# Patient Record
Sex: Male | Born: 1970 | Race: White | Hispanic: No | Marital: Married | Smoking: Former smoker
Health system: Southern US, Community
[De-identification: ages and names within clinical notes are randomized; demographics above are authoritative.]

## PROBLEM LIST (undated history)

## (undated) DIAGNOSIS — M6749 Ganglion, multiple sites: Secondary | ICD-10-CM

## (undated) DIAGNOSIS — R2230 Localized swelling, mass and lump, unspecified upper limb: Secondary | ICD-10-CM

## (undated) DIAGNOSIS — M7139 Other bursal cyst, multiple sites: Secondary | ICD-10-CM

## (undated) DIAGNOSIS — E785 Hyperlipidemia, unspecified: Secondary | ICD-10-CM

## (undated) DIAGNOSIS — M6789 Other specified disorders of synovium and tendon, multiple sites: Secondary | ICD-10-CM

## (undated) HISTORY — DX: Hyperlipidemia, unspecified: E78.5

## (undated) HISTORY — PX: REVISION OF SCAR ON FACE/HEAD: SHX2349

## (undated) HISTORY — PX: FINGER TENDON REPAIR: SHX1640

## (undated) HISTORY — PX: NASAL SINUS SURGERY: SHX719

## (undated) HISTORY — PX: EYE SURGERY: SHX253

---

## 2003-05-10 ENCOUNTER — Ambulatory Visit (HOSPITAL_COMMUNITY): Admission: RE | Admit: 2003-05-10 | Discharge: 2003-05-10 | Payer: Self-pay | Admitting: Gastroenterology

## 2006-05-27 ENCOUNTER — Ambulatory Visit: Payer: Self-pay | Admitting: Otolaryngology

## 2006-10-02 ENCOUNTER — Ambulatory Visit: Payer: Self-pay | Admitting: Family Medicine

## 2006-10-02 LAB — CONVERTED CEMR LAB
AST: 20 units/L (ref 0–37)
Albumin: 3.9 g/dL (ref 3.5–5.2)
Chloride: 106 meq/L (ref 96–112)
Cholesterol: 242 mg/dL (ref 0–200)
Creatinine, Ser: 1.2 mg/dL (ref 0.4–1.5)
GFR calc Af Amer: 90 mL/min
Glucose, Bld: 81 mg/dL (ref 70–99)
HDL: 41.3 mg/dL (ref >39.0)
MCHC: 34.7 g/dL (ref 30.0–36.0)
MCV: 88.7 fL (ref 78.0–100.0)
RBC: 5.33 M/uL (ref 4.22–5.81)
RDW: 12.9 % (ref 11.5–14.6)
Sodium: 141 meq/L (ref 135–145)
TSH: 1.38 microintl units/mL (ref 0.35–5.50)
Total Bilirubin: 1.2 mg/dL (ref 0.3–1.2)
Triglycerides: 135 mg/dL (ref 0–149)
VLDL: 27 mg/dL (ref 0–40)
WBC: 6.6 10*3/uL (ref 4.5–10.5)

## 2006-10-03 ENCOUNTER — Ambulatory Visit: Payer: Self-pay | Admitting: Family Medicine

## 2007-10-23 ENCOUNTER — Ambulatory Visit: Payer: Self-pay | Admitting: Family Medicine

## 2007-10-23 DIAGNOSIS — K6289 Other specified diseases of anus and rectum: Secondary | ICD-10-CM

## 2007-11-03 ENCOUNTER — Ambulatory Visit: Payer: Self-pay | Admitting: Family Medicine

## 2007-11-04 ENCOUNTER — Encounter: Payer: Self-pay | Admitting: Family Medicine

## 2007-11-04 DIAGNOSIS — K219 Gastro-esophageal reflux disease without esophagitis: Secondary | ICD-10-CM

## 2008-01-06 ENCOUNTER — Ambulatory Visit: Payer: Self-pay | Admitting: Family Medicine

## 2008-01-06 LAB — CONVERTED CEMR LAB
Albumin: 3.8 g/dL (ref 3.5–5.2)
Alkaline Phosphatase: 47 units/L (ref 39–117)
BUN: 15 mg/dL (ref 6–23)
Calcium: 9.1 mg/dL (ref 8.4–10.5)
Cholesterol: 165 mg/dL (ref 0–200)
Creatinine, Ser: 1.4 mg/dL (ref 0.4–1.5)
GFR calc Af Amer: 75 mL/min
GFR calc non Af Amer: 62 mL/min
Glucose, Bld: 131 mg/dL — ABNORMAL HIGH (ref 70–99)
HDL: 40.4 mg/dL (ref >39.0)
LDL Cholesterol: 101 mg/dL — ABNORMAL HIGH (ref 0–99)
TSH: 1.89 microintl units/mL (ref 0.35–5.50)
Total Protein: 6.5 g/dL (ref 6.0–8.3)
Triglycerides: 118 mg/dL (ref 0–149)
VLDL: 24 mg/dL (ref 0–40)

## 2008-06-16 ENCOUNTER — Ambulatory Visit: Payer: Self-pay | Admitting: Family Medicine

## 2008-06-16 DIAGNOSIS — R7309 Other abnormal glucose: Secondary | ICD-10-CM

## 2008-07-02 ENCOUNTER — Ambulatory Visit: Payer: Self-pay | Admitting: Family Medicine

## 2008-07-05 LAB — CONVERTED CEMR LAB: Glucose, Bld: 90 mg/dL (ref 70–99)

## 2009-03-30 ENCOUNTER — Telehealth: Payer: Self-pay | Admitting: Family Medicine

## 2009-07-28 ENCOUNTER — Ambulatory Visit: Payer: Self-pay | Admitting: Family Medicine

## 2009-07-28 LAB — CONVERTED CEMR LAB
Albumin: 4.3 g/dL (ref 3.5–5.2)
Alkaline Phosphatase: 51 units/L (ref 39–117)
Bilirubin, Direct: 0 mg/dL (ref 0.0–0.3)
CO2: 31 meq/L (ref 19–32)
Calcium: 9.3 mg/dL (ref 8.4–10.5)
Creatinine, Ser: 1.2 mg/dL (ref 0.4–1.5)
Creatinine,U: 175.3 mg/dL
Eosinophils Absolute: 0.1 10*3/uL (ref 0.0–0.7)
GFR calc non Af Amer: 72.73 mL/min (ref >60)
HDL: 54.1 mg/dL (ref >39.00)
Lymphocytes Relative: 37.2 % (ref 12.0–46.0)
MCHC: 34.2 g/dL (ref 30.0–36.0)
MCV: 95.3 fL (ref 78.0–100.0)
Microalb Creat Ratio: 6.3 mg/g (ref 0.0–30.0)
Microalb, Ur: 1.1 mg/dL (ref 0.0–1.9)
Monocytes Absolute: 0.5 10*3/uL (ref 0.1–1.0)
Neutrophils Relative %: 52.7 % (ref 43.0–77.0)
Platelets: 209 10*3/uL (ref 150.0–400.0)
Sodium: 142 meq/L (ref 135–145)
Total Protein: 7.3 g/dL (ref 6.0–8.3)
Triglycerides: 100 mg/dL (ref 0.0–149.0)
VLDL: 20 mg/dL (ref 0.0–40.0)
WBC: 5.3 10*3/uL (ref 4.5–10.5)

## 2009-08-02 ENCOUNTER — Ambulatory Visit: Payer: Self-pay | Admitting: Family Medicine

## 2010-04-17 ENCOUNTER — Encounter (INDEPENDENT_AMBULATORY_CARE_PROVIDER_SITE_OTHER): Payer: Self-pay | Admitting: *Deleted

## 2010-10-10 NOTE — Letter (Signed)
Summary: Nadara Eaton letter  [place] at [facility]  [address] [place], [place] [address]   Phone: [phone]  Fax: [phone]       04/17/2010 MRN: [id number]  [facility] [address] [place], [place]  [address]  Dear Mr. Dr[facility] [place], and [place] announce the retirement of Arta Silence, M.D., from full-time practice at the [facility] office effective March 09, 2010 and his plans of returning part-time.  It is important to Dr. Hetty Ely and to our practice that you understand that [facility] has seven physicians in our office for your health care needs.  We will continue to offer the same exceptional care that you have today.    Dr. Hetty Ely has spoken to many of you about his plans for retirement and returning part-time in the fall.   We will continue to work with you through the transition to schedule appointments for you in the office and meet the high standards that [place] is committed to.   Again, it is with great pleasure that we share the news that Dr. Hetty Ely will return to [facility] at [facility] in October of 2011 with a reduced schedule.    If you have any questions, or would like to request an appointment with one of our physicians, please call us at [phone] and press the option for Scheduling an appointment.  We take pleasure in providing you with excellent patient care and look forward to seeing you at your next office visit.  Our [facility] Physicians are:  Tillman Abide, M.D. Laurita Quint, M.D. Roxy Manns, M.D. Kerby Nora, M.D. Hannah Beat, M.D. Ruthe Mannan, M.D. We proudly welcomed Raechel Ache, M.D. and Eustaquio Boyden, M.D. to the practice in July/August 2011.  Sincerely,  [place] [facility]

## 2011-01-12 ENCOUNTER — Other Ambulatory Visit: Payer: Self-pay | Admitting: Family Medicine

## 2011-01-12 DIAGNOSIS — Z13220 Encounter for screening for lipoid disorders: Secondary | ICD-10-CM

## 2011-01-12 DIAGNOSIS — K219 Gastro-esophageal reflux disease without esophagitis: Secondary | ICD-10-CM

## 2011-01-12 DIAGNOSIS — R7309 Other abnormal glucose: Secondary | ICD-10-CM

## 2011-01-12 DIAGNOSIS — Z125 Encounter for screening for malignant neoplasm of prostate: Secondary | ICD-10-CM

## 2011-01-15 ENCOUNTER — Other Ambulatory Visit (INDEPENDENT_AMBULATORY_CARE_PROVIDER_SITE_OTHER): Payer: PRIVATE HEALTH INSURANCE

## 2011-01-15 ENCOUNTER — Encounter: Payer: Self-pay | Admitting: Family Medicine

## 2011-01-15 DIAGNOSIS — Z13220 Encounter for screening for lipoid disorders: Secondary | ICD-10-CM

## 2011-01-15 DIAGNOSIS — R7309 Other abnormal glucose: Secondary | ICD-10-CM

## 2011-01-15 DIAGNOSIS — K219 Gastro-esophageal reflux disease without esophagitis: Secondary | ICD-10-CM

## 2011-01-15 DIAGNOSIS — Z125 Encounter for screening for malignant neoplasm of prostate: Secondary | ICD-10-CM

## 2011-01-15 LAB — COMPREHENSIVE METABOLIC PANEL
ALT: 17 U/L (ref 0–53)
AST: 23 U/L (ref 0–37)
Albumin: 3.9 g/dL (ref 3.5–5.2)
CO2: 28 mEq/L (ref 19–32)
Calcium: 9.1 mg/dL (ref 8.4–10.5)
Chloride: 103 mEq/L (ref 96–112)
Creatinine, Ser: 1.1 mg/dL (ref 0.4–1.5)
GFR: 77.33 mL/min (ref >60.00)
Potassium: 4.6 mEq/L (ref 3.5–5.1)
Sodium: 137 mEq/L (ref 135–145)
Total Protein: 6.6 g/dL (ref 6.0–8.3)

## 2011-01-15 LAB — CBC WITH DIFFERENTIAL/PLATELET
Eosinophils Absolute: 0 10*3/uL (ref 0.0–0.7)
HCT: 45.1 % (ref 39.0–52.0)
Lymphs Abs: 1.3 10*3/uL (ref 0.7–4.0)
MCHC: 34.1 g/dL (ref 30.0–36.0)
MCV: 93.4 fl (ref 78.0–100.0)
Monocytes Absolute: 0.3 10*3/uL (ref 0.1–1.0)
Neutrophils Relative %: 69.2 % (ref 43.0–77.0)
Platelets: 203 10*3/uL (ref 150.0–400.0)
RDW: 13.2 % (ref 11.5–14.6)

## 2011-01-15 LAB — LIPID PANEL: HDL: 51.3 mg/dL (ref >39.00)

## 2011-01-15 LAB — MICROALBUMIN / CREATININE URINE RATIO
Creatinine,U: 54.2 mg/dL
Microalb, Ur: 0.2 mg/dL (ref 0.0–1.9)

## 2011-01-15 LAB — HEMOGLOBIN A1C: Hgb A1c MFr Bld: 5.5 % (ref 4.6–6.5)

## 2011-01-17 ENCOUNTER — Encounter: Payer: Self-pay | Admitting: Family Medicine

## 2011-01-17 ENCOUNTER — Ambulatory Visit (INDEPENDENT_AMBULATORY_CARE_PROVIDER_SITE_OTHER): Payer: PRIVATE HEALTH INSURANCE | Admitting: Family Medicine

## 2011-01-17 VITALS — BP 120/70 | HR 56 | Temp 97.8°F | Ht 69.0 in | Wt 201.0 lb

## 2011-01-17 DIAGNOSIS — Z0000 Encounter for general adult medical examination without abnormal findings: Secondary | ICD-10-CM

## 2011-01-17 NOTE — Progress Notes (Signed)
40 year old male:  Doing well  Preventative Health Maintenance Visit:  Health Maintenance Summary Reviewed and updated, unless pt declines services.  Tobacco History Reviewed. 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   Lab Results  Component Value Date   CHOL 168 01/15/2011   CHOL 227* 07/28/2009   CHOL 165 01/06/2008   Lab Results  Component Value Date   HDL 51.30 01/15/2011   HDL 54.10 07/28/2009   HDL 40.4 01/06/2008   Lab Results  Component Value Date   LDLCALC 107* 01/15/2011   LDLCALC 101* 01/06/2008   Lab Results  Component Value Date   TRIG 51.0 01/15/2011   TRIG 100.0 07/28/2009   TRIG 118 01/06/2008   Lab Results  Component Value Date   CHOLHDL 3 01/15/2011   CHOLHDL 4 07/28/2009   CHOLHDL 4.1 CALC 01/06/2008   Lab Results  Component Value Date   LDLDIRECT 162.9 07/28/2009   LDLDIRECT 164.4 10/02/2006    Lab Results  Component Value Date   WBC 5.5 01/15/2011   HGB 15.4 01/15/2011   HCT 45.1 01/15/2011   MCV 93.4 01/15/2011   PLT 203.0 01/15/2011    Lab Results  Component Value Date   ALT 17 01/15/2011   AST 23 01/15/2011   ALKPHOS 36* 01/15/2011   BILITOT 0.9 01/15/2011      Chemistry      Component Value Date/Time   NA 137 01/15/2011 0922   K 4.6 01/15/2011 0922   CL 103 01/15/2011 0922   CO2 28 01/15/2011 0922   BUN 14 01/15/2011 0922   CREATININE 1.1 01/15/2011 0922      Component Value Date/Time   CALCIUM 9.1 01/15/2011 0922   ALKPHOS 36* 01/15/2011 0922   AST 23 01/15/2011 0922   ALT 17 01/15/2011 0922   BILITOT 0.9 01/15/2011 0922       Lab Results  Component Value Date   PSA 0.25 01/15/2011   General: Denies fever, chills, sweats. No significant weight loss. Eyes: Denies blurring,significant itching ENT: Denies earache, sore throat, and  hoarseness. Cardiovascular: Denies chest pains, palpitations, dyspnea on exertion Respiratory: Denies cough, dyspnea at rest,wheeezing Breast: no concerns about lumps GI: Denies nausea, vomiting, diarrhea, constipation, change in bowel habits, abdominal pain, melena, hematochezia GU: Denies penile discharge, ED, urinary flow / outflow problems. No STD concerns. Musculoskeletal: Denies back pain, joint pain Derm: Denies rash, itching Neuro: Denies  paresthesias, frequent falls, frequent headaches Psych: Denies depression, anxiety Endocrine: Denies cold intolerance, heat intolerance, polydipsia Heme: Denies enlarged lymph nodes Allergy: No hayfever  A/P: Wellness exam The patient's preventative maintenance and recommended screening tests for an annual wellness exam were reviewed in full today. Brought up to date unless services declined.  Counselled on the importance of diet, exercise, and its role in overall health and mortality. The patient's FH and SH was reviewed, including their home life, tobacco status, and drug and alcohol status.

## 2011-09-11 HISTORY — PX: ELBOW SURGERY: SHX618

## 2011-10-04 ENCOUNTER — Ambulatory Visit: Payer: Self-pay | Admitting: Orthopedic Surgery

## 2011-10-12 DIAGNOSIS — R2230 Localized swelling, mass and lump, unspecified upper limb: Secondary | ICD-10-CM

## 2011-10-12 HISTORY — DX: Localized swelling, mass and lump, unspecified upper limb: R22.30

## 2011-10-19 ENCOUNTER — Other Ambulatory Visit: Payer: Self-pay | Admitting: Orthopedic Surgery

## 2011-10-22 ENCOUNTER — Encounter (HOSPITAL_BASED_OUTPATIENT_CLINIC_OR_DEPARTMENT_OTHER): Payer: Self-pay | Admitting: *Deleted

## 2011-10-26 ENCOUNTER — Ambulatory Visit (HOSPITAL_BASED_OUTPATIENT_CLINIC_OR_DEPARTMENT_OTHER): Payer: PRIVATE HEALTH INSURANCE | Admitting: Anesthesiology

## 2011-10-26 ENCOUNTER — Encounter (HOSPITAL_BASED_OUTPATIENT_CLINIC_OR_DEPARTMENT_OTHER): Payer: Self-pay | Admitting: Orthopedic Surgery

## 2011-10-26 ENCOUNTER — Other Ambulatory Visit: Payer: Self-pay | Admitting: Orthopedic Surgery

## 2011-10-26 ENCOUNTER — Encounter (HOSPITAL_BASED_OUTPATIENT_CLINIC_OR_DEPARTMENT_OTHER): Payer: Self-pay | Admitting: Anesthesiology

## 2011-10-26 ENCOUNTER — Ambulatory Visit (HOSPITAL_BASED_OUTPATIENT_CLINIC_OR_DEPARTMENT_OTHER)
Admission: RE | Admit: 2011-10-26 | Discharge: 2011-10-26 | Disposition: A | Discharge status: Home / Self Care | Payer: PRIVATE HEALTH INSURANCE | Source: Ambulatory Visit | Attending: Orthopedic Surgery | Admitting: Orthopedic Surgery

## 2011-10-26 ENCOUNTER — Encounter (HOSPITAL_BASED_OUTPATIENT_CLINIC_OR_DEPARTMENT_OTHER)
Admission: RE | Disposition: A | Discharge status: Home / Self Care | Payer: Self-pay | Source: Ambulatory Visit | Attending: Orthopedic Surgery

## 2011-10-26 DIAGNOSIS — M6740 Ganglion, unspecified site: Secondary | ICD-10-CM | POA: Insufficient documentation

## 2011-10-26 DIAGNOSIS — M7139 Other bursal cyst, multiple sites: Secondary | ICD-10-CM

## 2011-10-26 DIAGNOSIS — M6749 Ganglion, multiple sites: Secondary | ICD-10-CM

## 2011-10-26 DIAGNOSIS — K6289 Other specified diseases of anus and rectum: Secondary | ICD-10-CM

## 2011-10-26 DIAGNOSIS — K219 Gastro-esophageal reflux disease without esophagitis: Secondary | ICD-10-CM | POA: Insufficient documentation

## 2011-10-26 HISTORY — DX: Other specified disorders of synovium and tendon, multiple sites: M67.89

## 2011-10-26 HISTORY — PX: MASS EXCISION: SHX2000

## 2011-10-26 HISTORY — DX: Other bursal cyst, multiple sites: M71.39

## 2011-10-26 HISTORY — DX: Ganglion, multiple sites: M67.49

## 2011-10-26 HISTORY — DX: Localized swelling, mass and lump, unspecified upper limb: R22.30

## 2011-10-26 LAB — POCT HEMOGLOBIN-HEMACUE: Hemoglobin: 15.6 g/dL (ref 13.0–17.0)

## 2011-10-26 SURGERY — EXCISION MASS
Anesthesia: General | Laterality: Right

## 2011-10-26 MED ORDER — DEXAMETHASONE SODIUM PHOSPHATE 4 MG/ML IJ SOLN
INTRAMUSCULAR | Status: DC | PRN
  Administered 2011-10-26: 10 mg via INTRAVENOUS

## 2011-10-26 MED ORDER — DROPERIDOL 2.5 MG/ML IJ SOLN
INTRAMUSCULAR | Status: DC | PRN
  Administered 2011-10-26: 0.625 mg via INTRAVENOUS

## 2011-10-26 MED ORDER — MIDAZOLAM HCL 5 MG/5ML IJ SOLN
INTRAMUSCULAR | Status: DC | PRN
  Administered 2011-10-26: 2 mg via INTRAVENOUS

## 2011-10-26 MED ORDER — MORPHINE SULFATE 4 MG/ML IJ SOLN: 0.0500 mg/kg | INTRAMUSCULAR | Status: DC | PRN

## 2011-10-26 MED ORDER — ONDANSETRON HCL 4 MG/2ML IJ SOLN
INTRAMUSCULAR | Status: DC | PRN
  Administered 2011-10-26: 4 mg via INTRAVENOUS

## 2011-10-26 MED ORDER — FENTANYL CITRATE 0.05 MG/ML IJ SOLN
INTRAMUSCULAR | Status: DC | PRN
  Administered 2011-10-26: 25 ug via INTRAVENOUS
  Administered 2011-10-26: 100 ug via INTRAVENOUS

## 2011-10-26 MED ORDER — PROMETHAZINE HCL 25 MG PO TABS: 25.0000 mg | ORAL_TABLET | Freq: Four times a day (QID) | ORAL | Status: AC | PRN

## 2011-10-26 MED ORDER — BUPIVACAINE HCL (PF) 0.25 % IJ SOLN
INTRAMUSCULAR | Status: DC | PRN
  Administered 2011-10-26: 10 mL

## 2011-10-26 MED ORDER — LACTATED RINGERS IV SOLN
INTRAVENOUS | Status: DC
  Administered 2011-10-26 (×2): via INTRAVENOUS

## 2011-10-26 MED ORDER — LIDOCAINE HCL (CARDIAC) 20 MG/ML IV SOLN
INTRAVENOUS | Status: DC | PRN
  Administered 2011-10-26: 100 mg via INTRAVENOUS

## 2011-10-26 MED ORDER — PROPOFOL 10 MG/ML IV EMUL
INTRAVENOUS | Status: DC | PRN
  Administered 2011-10-26: 200 mg via INTRAVENOUS

## 2011-10-26 MED ORDER — OXYCODONE-ACETAMINOPHEN 5-325 MG PO TABS: 1.0000 | ORAL_TABLET | Freq: Four times a day (QID) | ORAL | Status: AC | PRN

## 2011-10-26 MED ORDER — CEFAZOLIN SODIUM 1-5 GM-% IV SOLN
INTRAVENOUS | Status: DC | PRN
  Administered 2011-10-26: 2 g via INTRAVENOUS

## 2011-10-26 MED ORDER — HYDROMORPHONE HCL PF 1 MG/ML IJ SOLN
0.2500 mg | INTRAMUSCULAR | Status: DC | PRN
  Administered 2011-10-26: 0.25 mg via INTRAVENOUS
  Administered 2011-10-26: 0.5 mg via INTRAVENOUS

## 2011-10-26 MED ORDER — METOCLOPRAMIDE HCL 5 MG/ML IJ SOLN: 10.0000 mg | Freq: Once | INTRAMUSCULAR | Status: DC | PRN

## 2011-10-26 SURGICAL SUPPLY — 55 items
APL SKNCLS STERI-STRIP NONHPOA (GAUZE/BANDAGES/DRESSINGS) ×1
BANDAGE ELASTIC 3 VELCRO ST LF (GAUZE/BANDAGES/DRESSINGS) ×2 IMPLANT
BANDAGE GAUZE ELAST BULKY 4 IN (GAUZE/BANDAGES/DRESSINGS) ×1 IMPLANT
BENZOIN TINCTURE PRP APPL 2/3 (GAUZE/BANDAGES/DRESSINGS) ×1 IMPLANT
BLADE SURG 15 STRL LF DISP TIS (BLADE) ×1 IMPLANT
BLADE SURG 15 STRL SS (BLADE) ×2
BNDG CMPR 9X4 STRL LF SNTH (GAUZE/BANDAGES/DRESSINGS) ×1
BNDG COHESIVE 4X5 TAN STRL (GAUZE/BANDAGES/DRESSINGS) ×1 IMPLANT
BNDG ESMARK 4X9 LF (GAUZE/BANDAGES/DRESSINGS) ×1 IMPLANT
CLOTH BEACON ORANGE TIMEOUT ST (SAFETY) ×2 IMPLANT
CORDS BIPOLAR (ELECTRODE) ×1 IMPLANT
COVER TABLE BACK 60X90 (DRAPES) ×2 IMPLANT
CUFF TOURNIQUET SINGLE 18IN (TOURNIQUET CUFF) ×1 IMPLANT
DRAPE EXTREMITY T 121X128X90 (DRAPE) ×2 IMPLANT
DRAPE SURG 17X23 STRL (DRAPES) ×2 IMPLANT
DRAPE U 20/CS (DRAPES) ×2 IMPLANT
DURAPREP 26ML APPLICATOR (WOUND CARE) ×2 IMPLANT
ELECT REM PT RETURN 9FT ADLT (ELECTROSURGICAL) ×2
ELECTRODE REM PT RTRN 9FT ADLT (ELECTROSURGICAL) IMPLANT
GLOVE BIO SURGEON STRL SZ 6.5 (GLOVE) ×1 IMPLANT
GLOVE BIO SURGEON STRL SZ7.5 (GLOVE) ×2 IMPLANT
GLOVE BIO SURGEON STRL SZ8 (GLOVE) ×1 IMPLANT
GLOVE BIOGEL PI IND STRL 7.0 (GLOVE) IMPLANT
GLOVE BIOGEL PI IND STRL 8 (GLOVE) ×2 IMPLANT
GLOVE BIOGEL PI INDICATOR 7.0 (GLOVE) ×2
GLOVE BIOGEL PI INDICATOR 8 (GLOVE) ×2
GLOVE ORTHO TXT STRL SZ7.5 (GLOVE) ×2 IMPLANT
GLOVE SURG ORTHO 8.0 STRL STRW (GLOVE) ×1 IMPLANT
GOWN PREVENTION PLUS XLARGE (GOWN DISPOSABLE) ×4 IMPLANT
NEEDLE HYPO 25X1 1.5 SAFETY (NEEDLE) ×2 IMPLANT
NS IRRIG 1000ML POUR BTL (IV SOLUTION) ×2 IMPLANT
PACK BASIN DAY SURGERY FS (CUSTOM PROCEDURE TRAY) ×2 IMPLANT
PAD CAST 3X4 CTTN HI CHSV (CAST SUPPLIES) ×1 IMPLANT
PADDING CAST ABS 3INX4YD NS (CAST SUPPLIES) ×1
PADDING CAST ABS 4INX4YD NS (CAST SUPPLIES) ×1
PADDING CAST ABS COTTON 3X4 (CAST SUPPLIES) ×1 IMPLANT
PADDING CAST ABS COTTON 4X4 ST (CAST SUPPLIES) ×1 IMPLANT
PADDING CAST COTTON 3X4 STRL (CAST SUPPLIES) ×2
PENCIL BUTTON HOLSTER BLD 10FT (ELECTRODE) ×1 IMPLANT
SLING ARM FOAM STRAP LRG (SOFTGOODS) ×1 IMPLANT
SPLINT FIBERGLASS 4X30 (CAST SUPPLIES) ×2 IMPLANT
SPONGE GAUZE 4X4 12PLY (GAUZE/BANDAGES/DRESSINGS) ×2 IMPLANT
STOCKINETTE 4X48 STRL (DRAPES) ×1 IMPLANT
STRIP CLOSURE SKIN 1/2X4 (GAUZE/BANDAGES/DRESSINGS) ×1 IMPLANT
SUT ETHILON 4 0 PS 2 18 (SUTURE) ×1 IMPLANT
SUT VIC AB 2-0 SH 18 (SUTURE) ×1 IMPLANT
SUT VIC AB 3-0 SH 27 (SUTURE) ×2
SUT VIC AB 3-0 SH 27X BRD (SUTURE) IMPLANT
SUT VIC AB 4-0 P-3 18XBRD (SUTURE) IMPLANT
SUT VIC AB 4-0 P3 18 (SUTURE) ×2
SYR BULB 3OZ (MISCELLANEOUS) ×2 IMPLANT
SYR CONTROL 10ML LL (SYRINGE) ×2 IMPLANT
TOWEL OR 17X24 6PK STRL BLUE (TOWEL DISPOSABLE) ×2 IMPLANT
UNDERPAD 30X30 INCONTINENT (UNDERPADS AND DIAPERS) ×2 IMPLANT
WATER STERILE IRR 1000ML POUR (IV SOLUTION) ×1 IMPLANT

## 2011-10-26 NOTE — Anesthesia Preprocedure Evaluation (Signed)
Anesthesia Evaluation  Patient identified by MRN, date of birth, ID band Patient awake    Reviewed: Allergy & Precautions, H&P , NPO status , Patient's Chart, lab work & pertinent test results, reviewed documented beta blocker date and time   Airway Mallampati: II TM Distance: >3 FB Neck ROM: full    Dental   Pulmonary neg pulmonary ROS,          Cardiovascular neg cardio ROS     Neuro/Psych Negative Neurological ROS  Negative Psych ROS   GI/Hepatic Neg liver ROS, GERD-  Medicated and Controlled,  Endo/Other  Negative Endocrine ROS  Renal/GU negative Renal ROS  Genitourinary negative   Musculoskeletal   Abdominal   Peds  Hematology negative hematology ROS (+)   Anesthesia Other Findings See surgeon's H&P   Reproductive/Obstetrics negative OB ROS                           Anesthesia Physical Anesthesia Plan  ASA: I  Anesthesia Plan: General   Post-op Pain Management:    Induction: Intravenous  Airway Management Planned: LMA  Additional Equipment:   Intra-op Plan:   Post-operative Plan: Extubation in OR  Informed Consent: I have reviewed the patients History and Physical, chart, labs and discussed the procedure including the risks, benefits and alternatives for the proposed anesthesia with the patient or authorized representative who has indicated his/her understanding and acceptance.     Plan Discussed with: CRNA and Surgeon  Anesthesia Plan Comments:         Anesthesia Quick Evaluation  

## 2011-10-26 NOTE — Anesthesia Postprocedure Evaluation (Signed)
Anesthesia Post Note  Patient: Justin Walker  Procedure(s) Performed: Procedure(s) (LRB): EXCISION MASS (Right)  Anesthesia type: General  Patient location: PACU  Post pain: Pain level controlled  Post assessment: Patient's Cardiovascular Status Stable  Last Vitals:  Filed Vitals:   10/26/11 1230  BP: 132/90  Pulse: 69  Temp:   Resp: 22    Post vital signs: Reviewed and stable  Level of consciousness: alert  Complications: No apparent anesthesia complications

## 2011-10-26 NOTE — Anesthesia Procedure Notes (Signed)
Procedure Name: LMA Insertion Date/Time: 10/26/2011 10:21 AM Performed by: Gladys Damme Pre-anesthesia Checklist: Patient identified, Emergency Drugs available, Suction available and Patient being monitored Patient Re-evaluated:Patient Re-evaluated prior to inductionOxygen Delivery Method: Circle System Utilized Preoxygenation: Pre-oxygenation with 100% oxygen Intubation Type: IV induction Ventilation: Mask ventilation without difficulty LMA: LMA inserted LMA Size: 4.0 Number of attempts: 1 Placement Confirmation: breath sounds checked- equal and bilateral and positive ETCO2 Tube secured with: Tape Dental Injury: Teeth and Oropharynx as per pre-operative assessment
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
## 2011-10-26 NOTE — H&P (Signed)
  PREOPERATIVE H&P  Chief Complaint: mass right elbow  HPI: Justin Walker is a 41 y.o. male who presents for preoperative history and physical with a diagnosis of mass right elbow. Symptoms are rated as moderate to severe, and have been worsening.  He wants to have the mass removed. He has elected for surgical management.   Past Medical History  Diagnosis Date  . Elbow mass 10/2011    right   Past Surgical History  Procedure Date  . Eye surgery   . Nasal sinus surgery   . Finger tendon repair   . Revision of scar on face/head     forehead   History   Social History  . Marital Status: Married    Spouse Name: N/A    Number of Children: 0  . Years of Education: N/A   Occupational History  . mill [employer]    Social History Main Topics  . Smoking status: Former Games developer  . Smokeless tobacco: Never Used   Comment: quit smoking 17-18 yrs. ago  . Alcohol Use: Yes     3 x/week  . Drug Use: No  . Sexually Active: None   Other Topics Concern  . None   Social History Narrative  . None   Family History  Problem Relation Age of Onset  . Kidney disease Father   . Hypertension Sister    Allergies  Allergen Reactions  . Amoxicillin Rash   Prior to Admission medications   Medication Sig Start Date End Date Taking? Authorizing Provider  aminocaproic acid (AMICAR) 500 MG tablet Take by mouth every 6 (six) hours.   Yes Historical Provider, MD     Positive ROS: All other systems have been reviewed and were otherwise negative with the exception of those mentioned in the HPI and as above.  Physical Exam: General: Alert, no acute distress Cardiovascular: No pedal edema Respiratory: No cyanosis, no use of accessory musculature GI: No organomegaly, abdomen is soft and non-tender Skin: No lesions in the area of chief complaint Neurologic: Sensation intact distally Psychiatric: Patient is competent for consent with normal mood and affect Lymphatic: No axillary or cervical  lymphadenopathy  MUSCULOSKELETAL: There is a 2 cm mass over the elbow, but the elbow itself has full motion with normal stability. This feels subcutaneous.  Assessment: mass right elbow  Plan: Plan for Procedure(s): EXCISION MASS  The risks benefits and alternatives were discussed with the patient including but not limited to the risks of nonoperative treatment, versus surgical intervention including infection, bleeding, nerve injury,  blood clots, cardiopulmonary complications, morbidity, mortality, among others, and they were willing to proceed.   Eulas Post, MD 10/26/2011 7:32 AM  '

## 2011-10-26 NOTE — Transfer of Care (Signed)
Immediate Anesthesia Transfer of Care Note  Patient: Justin Walker  Procedure(s) Performed: Procedure(s) (LRB): EXCISION MASS (Right)  Patient Location: PACU  Anesthesia Type: General  Level of Consciousness: awake and alert   Airway & Oxygen Therapy: Patient Spontanous Breathing and Patient connected to face mask oxygen  Post-op Assessment: Report given to PACU RN and Post -op Vital signs reviewed and stable  Post vital signs: Reviewed and stable  Complications: No apparent anesthesia complications

## 2011-10-26 NOTE — Discharge Instructions (Signed)
Ganglion A ganglion is a swelling under the skin that is filled with a thick, jelly-like substance. It is a synovial cyst. This is caused by a break (rupture) of the joint lining from the joint space. A ganglion often occurs near an area of repeated minor trauma (damage caused by an accident). Trauma may also be a repetitive movement at work or in a sport. TREATMENT  It often goes away without treatment. It may reappear later. Sometimes a ganglion may need to be surgically removed. Often they are drained and injected with a steroid. Sometimes they respond to:  Rest.   Splinting.  HOME CARE INSTRUCTIONS   Your caregiver will decide the best way of treating your ganglion. Do not try to break the ganglion yourself by pressing on it, poking it with a needle, or hitting it with a heavy object.   Use medications as directed.  SEEK MEDICAL CARE IF:   The ganglion becomes larger or more painful.   You have increased redness or swelling.   You have weakness or numbness in your hand or wrist.  MAKE SURE YOU:   Understand these instructions.   Will watch your condition.   Will get help right away if you are not doing well or get worse.  Document Released: 08/24/2000 Document Revised: 05/09/2011 Document Reviewed: 10/21/2007 ExitCare Patient Information 2012 [employer] [facility]  [address] [place], [place] [address] [phone]   Post Anesthesia Home Care Instructions  Activity: Get plenty of rest for the remainder of the day. A responsible adult should stay with you for 24 hours following the procedure.  For the next 24 hours, DO NOT: -Drive a car -Advertising copywriter -Drink alcoholic beverages -Take any medication unless instructed by your physician -Make any legal decisions or sign important papers.  Meals: Start with liquid foods such as gelatin or soup. Progress to regular foods as tolerated. Avoid greasy, spicy, heavy foods. If nausea and/or  vomiting occur, drink only clear liquids until the nausea and/or vomiting subsides. Call your physician if vomiting continues.  Special Instructions/Symptoms: Your throat may feel dry or sore from the anesthesia or the breathing tube placed in your throat during surgery. If this causes discomfort, gargle with warm salt water. The discomfort should disappear within 24 hours.    Call your surgeon if you experience:   1.  Fever over 101.0. 2.  Inability to urinate. 3.  Nausea and/or vomiting. 4.  Extreme swelling or bruising at the surgical site. 5.  Continued bleeding from the incision. 6.  Increased pain, redness or drainage from the incision. 7.  Problems related to your pain medication. Marland Kitchen

## 2011-10-29 ENCOUNTER — Encounter (HOSPITAL_BASED_OUTPATIENT_CLINIC_OR_DEPARTMENT_OTHER): Payer: Self-pay | Admitting: Orthopedic Surgery

## 2011-12-28 ENCOUNTER — Emergency Department: Payer: Self-pay | Admitting: Emergency Medicine

## 2012-02-27 ENCOUNTER — Other Ambulatory Visit: Payer: Self-pay | Admitting: Family Medicine

## 2012-02-27 DIAGNOSIS — Z13220 Encounter for screening for lipoid disorders: Secondary | ICD-10-CM

## 2012-02-27 DIAGNOSIS — Z131 Encounter for screening for diabetes mellitus: Secondary | ICD-10-CM

## 2012-02-27 DIAGNOSIS — R5383 Other fatigue: Secondary | ICD-10-CM

## 2012-02-27 DIAGNOSIS — R5381 Other malaise: Secondary | ICD-10-CM

## 2012-02-28 ENCOUNTER — Other Ambulatory Visit (INDEPENDENT_AMBULATORY_CARE_PROVIDER_SITE_OTHER): Payer: PRIVATE HEALTH INSURANCE

## 2012-02-28 DIAGNOSIS — Z13220 Encounter for screening for lipoid disorders: Secondary | ICD-10-CM

## 2012-02-28 DIAGNOSIS — Z131 Encounter for screening for diabetes mellitus: Secondary | ICD-10-CM

## 2012-02-28 DIAGNOSIS — R5381 Other malaise: Secondary | ICD-10-CM

## 2012-02-28 LAB — BASIC METABOLIC PANEL
BUN: 19 mg/dL (ref 6–23)
Calcium: 9.3 mg/dL (ref 8.4–10.5)
Creatinine, Ser: 1.3 mg/dL (ref 0.4–1.5)
GFR: 64.82 mL/min (ref >60.00)
Glucose, Bld: 79 mg/dL (ref 70–99)
Potassium: 4.1 mEq/L (ref 3.5–5.1)

## 2012-02-28 LAB — CBC WITH DIFFERENTIAL/PLATELET
Basophils Relative: 0.5 % (ref 0.0–3.0)
Eosinophils Relative: 0.7 % (ref 0.0–5.0)
HCT: 47.8 % (ref 39.0–52.0)
MCV: 93.4 fl (ref 78.0–100.0)
Monocytes Absolute: 0.4 10*3/uL (ref 0.1–1.0)
Monocytes Relative: 7.6 % (ref 3.0–12.0)
Neutrophils Relative %: 59 % (ref 43.0–77.0)
Platelets: 226 10*3/uL (ref 150.0–400.0)
RBC: 5.12 Mil/uL (ref 4.22–5.81)
WBC: 5.4 10*3/uL (ref 4.5–10.5)

## 2012-02-28 LAB — HEPATIC FUNCTION PANEL
ALT: 16 U/L (ref 0–53)
AST: 23 U/L (ref 0–37)
Bilirubin, Direct: 0.1 mg/dL (ref 0.0–0.3)
Total Bilirubin: 0.9 mg/dL (ref 0.3–1.2)
Total Protein: 7.3 g/dL (ref 6.0–8.3)

## 2012-02-28 LAB — LDL CHOLESTEROL, DIRECT: Direct LDL: 143.2 mg/dL

## 2012-02-28 LAB — LIPID PANEL
HDL: 53.2 mg/dL (ref >39.00)
Total CHOL/HDL Ratio: 4

## 2012-03-06 ENCOUNTER — Encounter: Payer: Self-pay | Admitting: Family Medicine

## 2012-03-06 ENCOUNTER — Ambulatory Visit (INDEPENDENT_AMBULATORY_CARE_PROVIDER_SITE_OTHER): Payer: PRIVATE HEALTH INSURANCE | Admitting: Family Medicine

## 2012-03-06 VITALS — BP 120/82 | HR 56 | Temp 98.6°F | Ht 69.0 in | Wt 205.5 lb

## 2012-03-06 DIAGNOSIS — R000 Tachycardia, unspecified: Secondary | ICD-10-CM

## 2012-03-06 DIAGNOSIS — Z0000 Encounter for general adult medical examination without abnormal findings: Secondary | ICD-10-CM
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
## 2012-03-06 NOTE — Patient Instructions (Addendum)
REFERRAL: GO THE THE FRONT ROOM AT THE ENTRANCE OF OUR CLINIC, NEAR CHECK IN. ASK FOR MARION. SHE WILL HELP YOU SET UP YOUR REFERRAL. DATE: TIME:  

## 2012-03-19 ENCOUNTER — Ambulatory Visit: Payer: PRIVATE HEALTH INSURANCE | Admitting: Cardiovascular Disease

## 2012-03-21 ENCOUNTER — Encounter: Payer: Self-pay | Admitting: Cardiovascular Disease

## 2012-03-21 ENCOUNTER — Ambulatory Visit (INDEPENDENT_AMBULATORY_CARE_PROVIDER_SITE_OTHER): Payer: PRIVATE HEALTH INSURANCE | Admitting: Cardiovascular Disease

## 2012-03-21 VITALS — BP 138/98 | HR 62 | Ht 69.0 in | Wt 202.0 lb

## 2012-03-21 DIAGNOSIS — R000 Tachycardia, unspecified: Secondary | ICD-10-CM | POA: Insufficient documentation

## 2012-03-21 NOTE — Assessment & Plan Note (Addendum)
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 Episodes over the past several years lasting less than 15 minutes. We have discussed with him the various treatment options which includes Holter or event monitor to identify the rhythm, medications on an as-needed basis (though this would likely not work given the short duration of his episodes), ablation if SVT is identified. He's not interested in taking medications on a daily basis. Identifying the rhythm would be needed prior to ablation.  He has not had episodes in quite some time and he would prefer to monitor his symptoms for now and call our office if episodes start to recur. He would call us for a 30 day monitor.  Medication options are limited as diltiazem and metoprolol would likely cause excess bradycardia. Antiarrhythmics could potentially work but he is not interested at this time.

## 2012-03-21 NOTE — Progress Notes (Signed)
   Patient ID: Justin Walker, male    DOB: 26-Mar-1971, 41 y.o.   MRN: [id number]  HPI Comments: Justin Walker is a very pleasant 41 year old gentleman who has active hobbies including cycling and scuba diving with decompression sickness incident last year. He presents for evaluation of tachycardia.  He reports that for several years, he has had episodes of tachycardia that come on with exertion. He has noticed that most when he is cycling at a high level of performance and heart rate will rapidly increase to 180 beats per minute or more, sometimes greater than 200 beats per minute. The longest episode was 15 minutes. He has had at least one half dozen episodes this year. Last episode was more than one month ago. Typically it comes on more during his biking season. He has tried Valsalva maneuvers with limited success. Rhythms usually decrease slowly with an acute drop down to his normal rate when he lays on his back and puts his legs up. He does recall when he continued to exercise while in this rapid rhythm, he did have a near syncope or syncope episode. He typically has baseline bradycardia  He has not been able to have any monitoring or EKG while in his rhythm.  EKG today shows normal sinus rhythm with rate 55 beats a minute, no significant ST or T changes   Outpatient Encounter Prescriptions as of 03/21/2012  Medication Sig Dispense Refill  . Amino Acids (AMINO ACID PO) Take by mouth daily.      . Multiple Vitamin (MULTIVITAMIN) tablet Take 1 tablet by mouth 2 (two) times daily.       Review of Systems  Constitutional: Negative.   HENT: Negative.   Eyes: Negative.   Respiratory: Negative.   Cardiovascular: Positive for palpitations.  Gastrointestinal: Negative.   Musculoskeletal: Negative.   Skin: Negative.   Neurological: Negative.   Hematological: Negative.   Psychiatric/Behavioral: Negative.   All other systems reviewed and are negative.    BP 138/98  Pulse 62  Ht 5\' 9"  (1.753 m)   Wt 202 lb (91.627 kg)  BMI 29.83 kg/m2  Physical Exam  Nursing note and vitals reviewed. Constitutional: He is oriented to person, place, and time. He appears well-developed and well-nourished.  HENT:  Head: Normocephalic.  Nose: Nose normal.  Mouth/Throat: Oropharynx is clear and moist.  Eyes: Conjunctivae are normal. Pupils are equal, round, and reactive to light.  Neck: Normal range of motion. Neck supple. No JVD present.  Cardiovascular: Normal rate, regular rhythm, S1 normal, S2 normal, normal heart sounds and intact distal pulses.  Exam reveals no gallop and no friction rub.   No murmur heard. Pulmonary/Chest: Effort normal and breath sounds normal. No respiratory distress. He has no wheezes. He has no rales. He exhibits no tenderness.  Abdominal: Soft. Bowel sounds are normal. He exhibits no distension. There is no tenderness.  Musculoskeletal: Normal range of motion. He exhibits no edema and no tenderness.  Lymphadenopathy:    He has no cervical adenopathy.  Neurological: He is alert and oriented to person, place, and time. Coordination normal.  Skin: Skin is warm and dry. No rash noted. No erythema.  Psychiatric: He has a normal mood and affect. His behavior is normal. Judgment and thought content normal.           Assessment and Plan

## 2012-03-21 NOTE — Patient Instructions (Addendum)
Please call if you have any more tachycardia, we would do a 30 day monitor   Please call us if you have new issues that need to be addressed before your next appt.  Your physician wants you to follow-up in:12 months.  You will receive a reminder letter in the mail two months in advance. If you don't receive a letter, please call our office to schedule the follow-up appointment.

## 2012-04-07 ENCOUNTER — Telehealth: Payer: Self-pay | Admitting: Family Medicine

## 2012-04-07 NOTE — Telephone Encounter (Signed)
Call  He doesn't need to come in. There is not much to do other than finish out the meds and give it time. Hope he feels better.

## 2012-04-07 NOTE — Telephone Encounter (Signed)
Pt went to Urgent Care and was diagnosed with Shingles. They gave him some meds for treatment but he wanted to let you know about the situation and see if there was anything you recommend him do or if you wanted him to come in ???

## 2012-04-07 NOTE — Telephone Encounter (Signed)
Patient advised.

## 2013-05-14 ENCOUNTER — Ambulatory Visit (INDEPENDENT_AMBULATORY_CARE_PROVIDER_SITE_OTHER): Payer: PRIVATE HEALTH INSURANCE | Admitting: Cardiovascular Disease

## 2013-05-14 ENCOUNTER — Encounter: Payer: Self-pay | Admitting: Cardiovascular Disease

## 2013-05-14 VITALS — BP 132/90 | HR 60 | Ht 69.0 in | Wt 200.5 lb

## 2013-05-14 DIAGNOSIS — R000 Tachycardia, unspecified: Secondary | ICD-10-CM

## 2013-05-14 NOTE — Patient Instructions (Addendum)
You are doing well. No medication changes were made.  Consider red Yeast Rice for cholesterol (1 to 4 a day)  Call when you would like a holter monitor  Please call us if you have new issues that need to be addressed before your next appt.  Your physician wants you to follow-up in: 12 months.  You will receive a reminder letter in the mail two months in advance. If you don't receive a letter, please call our office to schedule the follow-up appointment.

## 2013-05-14 NOTE — Assessment & Plan Note (Signed)
Suspect SVT given the speed of his arrhythmia. We have recommended he call in the winter time when he is bike riding in the cold weather as he is more likely to have one of these rhythms in this setting. We'll call our office to arrange a Holter monitor at this time. We did discuss possible identification of his arrhythmia, ablation if needed. He did not want medications for workup at this time. Would prefer to wait.
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
## 2013-06-08 ENCOUNTER — Other Ambulatory Visit: Payer: Self-pay | Admitting: Family Medicine

## 2013-06-08 DIAGNOSIS — Z13220 Encounter for screening for lipoid disorders: Secondary | ICD-10-CM

## 2013-06-08 DIAGNOSIS — R5381 Other malaise: Secondary | ICD-10-CM

## 2013-06-15 ENCOUNTER — Other Ambulatory Visit (INDEPENDENT_AMBULATORY_CARE_PROVIDER_SITE_OTHER): Payer: PRIVATE HEALTH INSURANCE

## 2013-06-15 DIAGNOSIS — R5381 Other malaise: Secondary | ICD-10-CM

## 2013-06-15 DIAGNOSIS — Z13220 Encounter for screening for lipoid disorders: Secondary | ICD-10-CM

## 2013-06-15 LAB — CBC WITH DIFFERENTIAL/PLATELET
Basophils Relative: 0.5 % (ref 0.0–3.0)
Eosinophils Relative: 1 % (ref 0.0–5.0)
HCT: 43.4 % (ref 39.0–52.0)
Hemoglobin: 14.7 g/dL (ref 13.0–17.0)
Lymphs Abs: 1.6 10*3/uL (ref 0.7–4.0)
MCV: 93.6 fl (ref 78.0–100.0)
Monocytes Absolute: 0.4 10*3/uL (ref 0.1–1.0)
Monocytes Relative: 7.8 % (ref 3.0–12.0)
Platelets: 219 10*3/uL (ref 150.0–400.0)
RBC: 4.64 Mil/uL (ref 4.22–5.81)
WBC: 4.8 10*3/uL (ref 4.5–10.5)

## 2013-06-15 LAB — HEPATIC FUNCTION PANEL
ALT: 21 U/L (ref 0–53)
AST: 30 U/L (ref 0–37)
Total Bilirubin: 1.1 mg/dL (ref 0.3–1.2)
Total Protein: 7.1 g/dL (ref 6.0–8.3)

## 2013-06-15 LAB — BASIC METABOLIC PANEL
BUN: 16 mg/dL (ref 6–23)
Calcium: 9.1 mg/dL (ref 8.4–10.5)
GFR: 86.92 mL/min (ref >60.00)
Glucose, Bld: 88 mg/dL (ref 70–99)
Sodium: 134 mEq/L — ABNORMAL LOW (ref 135–145)

## 2013-06-15 LAB — LIPID PANEL: Total CHOL/HDL Ratio: 3

## 2013-06-22 ENCOUNTER — Encounter: Payer: Self-pay | Admitting: Family Medicine

## 2013-06-22 ENCOUNTER — Ambulatory Visit (INDEPENDENT_AMBULATORY_CARE_PROVIDER_SITE_OTHER): Payer: PRIVATE HEALTH INSURANCE | Admitting: Family Medicine

## 2013-06-22 VITALS — BP 140/80 | HR 67 | Temp 98.6°F | Ht 68.0 in | Wt 195.5 lb

## 2013-06-22 DIAGNOSIS — Z0000 Encounter for general adult medical examination without abnormal findings: Secondary | ICD-10-CM

## 2013-06-22 NOTE — Progress Notes (Signed)
Date:  06/22/2013   Name:  Justin Walker   DOB:  02/03/1971   MRN:  [id number] Gender: male Age: 42 y.o.  Primary Physician:  Hannah Beat, MD   Chief Complaint: Annual Exam   History of Present Illness:  Justin Walker is a 42 y.o. very pleasant male patient who presents with the following:  Cpx:  8 weeks ago, about six weeks ago, sex drive was up a lot. Lifestyle had not changed. About every day.  Was on humapro.   Doing 4 week blocks upwards of 12 hours in a week.  Stressed out some   Preventative Health Maintenance Visit:  Health Maintenance Summary Reviewed and updated, unless pt declines services.  Tobacco History Reviewed. Alcohol: No concerns, no excessive use Exercise Habits: biking 12 hours a week plus STD concerns: no risk or activity to increase risk Drug Use: None Encouraged self-testicular check  Labs reviewed with the patient.  Results for orders placed in visit on 06/15/13  LIPID PANEL      Result Value Range   Cholesterol 184  0 - 200 mg/dL   Triglycerides 40.9  0.0 - 149.0 mg/dL   HDL 81.19  >14.78 mg/dL   VLDL 29.5  0.0 - 62.1 mg/dL   LDL Cholesterol 308 (*) 0 - 99 mg/dL   Total CHOL/HDL Ratio 3    CBC WITH DIFFERENTIAL      Result Value Range   WBC 4.8  4.5 - 10.5 K/uL   RBC 4.64  4.22 - 5.81 Mil/uL   Hemoglobin 14.7  13.0 - 17.0 g/dL   HCT 65.7  84.6 - 96.2 %   MCV 93.6  78.0 - 100.0 fl   MCHC 33.9  30.0 - 36.0 g/dL   RDW 95.2  84.1 - 32.4 %   Platelets 219.0  150.0 - 400.0 K/uL   Neutrophils Relative % 58.6  43.0 - 77.0 %   Lymphocytes Relative 32.1  12.0 - 46.0 %   Monocytes Relative 7.8  3.0 - 12.0 %   Eosinophils Relative 1.0  0.0 - 5.0 %   Basophils Relative 0.5  0.0 - 3.0 %   Neutro Abs 2.8  1.4 - 7.7 K/uL   Lymphs Abs 1.6  0.7 - 4.0 K/uL   Monocytes Absolute 0.4  0.1 - 1.0 K/uL   Eosinophils Absolute 0.0  0.0 - 0.7 K/uL   Basophils Absolute 0.0  0.0 - 0.1 K/uL  HEPATIC FUNCTION PANEL      Result Value Range   Total  Bilirubin 1.1  0.3 - 1.2 mg/dL   Bilirubin, Direct 0.1  0.0 - 0.3 mg/dL   Alkaline Phosphatase 34 (*) 39 - 117 U/L   AST 30  0 - 37 U/L   ALT 21  0 - 53 U/L   Total Protein 7.1  6.0 - 8.3 g/dL   Albumin 4.1  3.5 - 5.2 g/dL  BASIC METABOLIC PANEL      Result Value Range   Sodium 134 (*) 135 - 145 mEq/L   Potassium 4.1  3.5 - 5.1 mEq/L   Chloride 103  96 - 112 mEq/L   CO2 26  19 - 32 mEq/L   Glucose, Bld 88  70 - 99 mg/dL   BUN 16  6 - 23 mg/dL   Creatinine, Ser 1.0  0.4 - 1.5 mg/dL   Calcium 9.1  8.4 - 40.1 mg/dL   GFR 02.72  >53.66 mL/min     Past Medical History, Surgical  History, Social History, Family History, Problem List, Medications, and Allergies have been reviewed and updated if relevant.  Current Outpatient Prescriptions on File Prior to Visit  Medication Sig Dispense Refill  . Amino Acids (AMINO ACID PO) Take by mouth daily.      . Multiple Vitamin (MULTIVITAMIN) tablet Take 1 tablet by mouth 2 (two) times daily.       No current facility-administered medications on file prior to visit.    Review of Systems:  General: Denies fever, chills, sweats. No significant weight loss. Eyes: Denies blurring,significant itching ENT: Denies earache, sore throat, and hoarseness. Cardiovascular: Denies chest pains, palpitations, dyspnea on exertion Respiratory: Denies cough, dyspnea at rest,wheeezing Breast: no concerns about lumps GI: Denies nausea, vomiting, diarrhea, constipation, change in bowel habits, abdominal pain, melena, hematochezia GU: Denies penile discharge, ED, urinary flow / outflow problems. No STD concerns. Musculoskeletal: Denies back pain, joint pain Derm: Denies rash, itching Neuro: Denies  paresthesias, frequent falls, frequent headaches Psych: Denies depression, anxiety Endocrine: Denies cold intolerance, heat intolerance, polydipsia Heme: Denies enlarged lymph nodes Allergy: No hayfever  Physical Examination: BP 140/80  Pulse 67  Temp(Src) 98.6  F (37 C) (Oral)  Ht 5\' 8"  (1.727 m)  Wt 195 lb 8 oz (88.678 kg)  BMI 29.73 kg/m2  GEN: well developed, well nourished, no acute distress Eyes: conjunctiva and lids normal, PERRLA, EOMI ENT: TM clear, nares clear, oral exam WNL Neck: supple, no lymphadenopathy, no thyromegaly, no JVD Pulm: clear to auscultation and percussion, respiratory effort normal CV: regular rate and rhythm, S1-S2, no murmur, rub or gallop, no bruits, peripheral pulses normal and symmetric, no cyanosis, clubbing, edema or varicosities GI: soft, non-tender; no hepatosplenomegaly, masses; active bowel sounds all quadrants GU: no hernia, testicular mass, penile discharge Lymph: no cervical, axillary or inguinal adenopathy MSK: gait normal, muscle tone and strength WNL, no joint swelling, effusions, discoloration, crepitus  SKIN: clear, good turgor, color WNL, no rashes, lesions, or ulcerations Neuro: normal mental status, normal strength, sensation, and motion Psych: alert; oriented to person, place and time, normally interactive and not anxious or depressed in appearance.  Assessment and Plan: Routine general medical examination at a health care facility   I cannot fully explain why increased sex drive a few months ago. Otherwise he is doing great.   The patient's preventative maintenance and recommended screening tests for an annual wellness exam were reviewed in full today. Brought up to date unless services declined.  Counselled on the importance of diet, exercise, and its role in overall health and mortality. The patient's FH and SH was reviewed, including their home life, tobacco status, and drug and alcohol status.   Signed,  Elpidio Galea. Shaney Deckman, MD, CAQ Sports Medicine  [facility] [address] [place] [address] Phone: [phone] Fax: [phone]

## 2014-07-28 ENCOUNTER — Ambulatory Visit: Payer: PRIVATE HEALTH INSURANCE | Admitting: Cardiovascular Disease

## 2014-08-01 ENCOUNTER — Ambulatory Visit: Payer: Self-pay | Admitting: Medical

## 2015-09-19 ENCOUNTER — Other Ambulatory Visit: Payer: PRIVATE HEALTH INSURANCE

## 2015-09-21 ENCOUNTER — Other Ambulatory Visit: Payer: Self-pay | Admitting: Family Medicine

## 2015-09-21 ENCOUNTER — Other Ambulatory Visit (INDEPENDENT_AMBULATORY_CARE_PROVIDER_SITE_OTHER): Payer: PRIVATE HEALTH INSURANCE

## 2015-09-21 DIAGNOSIS — R5383 Other fatigue: Secondary | ICD-10-CM

## 2015-09-21 DIAGNOSIS — Z13220 Encounter for screening for lipoid disorders: Secondary | ICD-10-CM | POA: Diagnosis not present

## 2015-09-21 DIAGNOSIS — Z79899 Other long term (current) drug therapy: Secondary | ICD-10-CM

## 2015-09-21 LAB — BASIC METABOLIC PANEL
BUN: 18 mg/dL (ref 6–23)
CHLORIDE: 102 meq/L (ref 96–112)
CO2: 30 mEq/L (ref 19–32)
Calcium: 9.4 mg/dL (ref 8.4–10.5)
Creatinine, Ser: 1.22 mg/dL (ref 0.40–1.50)
GFR: 69.12 mL/min (ref >60.00)
GLUCOSE: 90 mg/dL (ref 70–99)
POTASSIUM: 4.2 meq/L (ref 3.5–5.1)
Sodium: 138 mEq/L (ref 135–145)

## 2015-09-21 LAB — HEPATIC FUNCTION PANEL
ALBUMIN: 4.5 g/dL (ref 3.5–5.2)
ALT: 29 U/L (ref 0–53)
AST: 27 U/L (ref 0–37)
Alkaline Phosphatase: 49 U/L (ref 39–117)
BILIRUBIN TOTAL: 0.7 mg/dL (ref 0.2–1.2)
Bilirubin, Direct: 0.1 mg/dL (ref 0.0–0.3)
TOTAL PROTEIN: 7.3 g/dL (ref 6.0–8.3)

## 2015-09-21 LAB — CBC WITH DIFFERENTIAL/PLATELET
BASOS PCT: 0.5 % (ref 0.0–3.0)
Basophils Absolute: 0 10*3/uL (ref 0.0–0.1)
EOS PCT: 1.4 % (ref 0.0–5.0)
Eosinophils Absolute: 0.1 10*3/uL (ref 0.0–0.7)
HCT: 50 % (ref 39.0–52.0)
HEMOGLOBIN: 16.9 g/dL (ref 13.0–17.0)
Lymphocytes Relative: 34.4 % (ref 12.0–46.0)
Lymphs Abs: 2 10*3/uL (ref 0.7–4.0)
MCHC: 33.8 g/dL (ref 30.0–36.0)
MCV: 93.8 fl (ref 78.0–100.0)
MONO ABS: 0.5 10*3/uL (ref 0.1–1.0)
Monocytes Relative: 8.1 % (ref 3.0–12.0)
Neutro Abs: 3.2 10*3/uL (ref 1.4–7.7)
Neutrophils Relative %: 55.6 % (ref 43.0–77.0)
Platelets: 263 10*3/uL (ref 150.0–400.0)
RBC: 5.33 Mil/uL (ref 4.22–5.81)
RDW: 14.3 % (ref 11.5–15.5)
WBC: 5.7 10*3/uL (ref 4.0–10.5)

## 2015-09-21 LAB — LDL CHOLESTEROL, DIRECT: Direct LDL: 131 mg/dL

## 2015-09-21 LAB — LIPID PANEL
Cholesterol: 224 mg/dL — ABNORMAL HIGH (ref 0–200)
HDL: 52.8 mg/dL (ref >39.00)
NonHDL: 170.92
TRIGLYCERIDES: 255 mg/dL — AB (ref 0.0–149.0)
Total CHOL/HDL Ratio: 4
VLDL: 51 mg/dL — ABNORMAL HIGH (ref 0.0–40.0)

## 2015-09-21 NOTE — Addendum Note (Signed)
Addended by: Baldomero LamyHAVERS, NATASHA C on: 09/21/2015 09:15 AM   Modules accepted: Orders

## 2015-09-23 LAB — TESTOSTERONE TOTAL,FREE,BIO, MALES
Albumin: 4.4 g/dL (ref 3.6–5.1)
SEX HORMONE BINDING: 16 nmol/L (ref 10–50)
TESTOSTERONE FREE: 92.2 pg/mL (ref 47.0–244.0)
TESTOSTERONE: 409 ng/dL (ref 250–827)
Testosterone, Bioavailable: 185.5 ng/dL (ref 130.5–681.7)

## 2015-09-26 ENCOUNTER — Ambulatory Visit (INDEPENDENT_AMBULATORY_CARE_PROVIDER_SITE_OTHER): Payer: PRIVATE HEALTH INSURANCE | Admitting: Family Medicine

## 2015-09-26 VITALS — BP 134/84 | HR 69 | Temp 98.6°F | Ht 68.0 in | Wt 243.0 lb

## 2015-09-26 DIAGNOSIS — Z0000 Encounter for general adult medical examination without abnormal findings: Secondary | ICD-10-CM | POA: Diagnosis not present

## 2015-09-26 MED ORDER — HYDROCHLOROTHIAZIDE 12.5 MG PO TABS: 12.5000 mg | ORAL_TABLET | Freq: Every day | ORAL | Status: DC

## 2015-09-26 NOTE — Progress Notes (Signed)
Dr. Frederico Hamman T. Andriel Omalley, MD, [facility] [place], [address] Phone: [phone] Fax: [phone]  09/26/2015  Patient: Justin Walker, MRN: [id number], DOB: 03/20/1971, 45 y.o.  Primary Physician:  Owens Loffler, MD   Chief Complaint  Patient presents with  . Annual Exam   Subjective:   Justin Walker is a 45 y.o. pleasant patient who presents with the following:  Preventative Health Maintenance Visit:  Wt Readings from Last 3 Encounters:  09/26/15 243 lb (110.224 kg)  06/22/13 195 lb 8 oz (88.678 kg)  05/14/13 200 lb 8 oz (90.946 kg)   50 pound weight gain!  Health Maintenance Summary Reviewed and updated, unless pt declines services.  Tobacco History Reviewed. Alcohol: No concerns, no excessive use Exercise Habits: Some activity, rec at least 30 mins 5 times a week STD concerns: no risk or activity to increase risk Drug Use: None Encouraged self-testicular check  Health Maintenance  Topic Date Due  . HIV Screening  05/17/1988  . TETANUS/TDAP  09/10/2009  . INFLUENZA VACCINE  04/11/2015   Immunization History  Administered Date(s) Administered  . Td 09/11/1999   Patient Active Problem List   Diagnosis Date Noted  . Tachycardia 03/21/2012  . GERD 11/04/2007   Past Medical History  Diagnosis Date  . 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 Ganglion and cyst of synovium, tendon and bursa, right elbow 10/26/2011  . Hyperlipidemia    Past Surgical History  Procedure Laterality Date  . Eye surgery    . Nasal sinus surgery    . Finger tendon repair    . Revision of scar on face/head      forehead  . Mass excision  10/26/2011    Procedure: EXCISION MASS;  Surgeon: Johnny Bridge, MD;  Location: [facility];  Service: Orthopedics;  Laterality: Right;  excision tumor soft tissue right elbow subcutaneous less than 3cm  . Elbow surgery  2013    right   Social History   Social History    . Marital Status: Married    Spouse Name: N/A  . Number of Children: 0  . Years of Education: N/A   Occupational History  . mill [employer]    Social History Main Topics  . Smoking status: Former Research scientist (life sciences)  . Smokeless tobacco: Never Used     Comment: quit smoking 17-18 yrs. ago  . Alcohol Use: Yes     Comment: 3 x/week  . Drug Use: No  . Sexual Activity: Not on file   Other Topics Concern  . Not on file   Social History Narrative  . No narrative on file   Family History  Problem Relation Age of Onset  . Kidney disease Father   . Hypertension Sister    Allergies  Allergen Reactions  . Amoxicillin Rash    Medication list has been reviewed and updated.   General: Denies fever, chills, sweats. No significant weight loss. Eyes: Denies blurring,significant itching ENT: Denies earache, sore throat, and hoarseness. Cardiovascular: Denies chest pains, palpitations, dyspnea on exertion Respiratory: Denies cough, dyspnea at rest,wheeezing Breast: no concerns about lumps GI: Denies nausea, vomiting, diarrhea, constipation, change in bowel habits, abdominal pain, melena, hematochezia GU: Denies penile discharge, ED, urinary flow / outflow problems. No STD concerns. Musculoskeletal: Denies back pain, joint pain Derm: Denies rash, itching Neuro: Denies  paresthesias, frequent falls, frequent headaches Psych: Denies depression, anxiety Endocrine: Denies cold intolerance, heat intolerance,  polydipsia Heme: Denies enlarged lymph nodes Allergy: No hayfever  Objective:   BP 134/84 mmHg  Pulse 69  Temp(Src) 98.6 F (37 C) (Oral)  Ht '5\' 8"'$  (1.727 m)  Wt 243 lb (110.224 kg)  BMI 36.96 kg/m2  SpO2 98% Ideal Body Weight: Weight in (lb) to have BMI = 25: 164.1  No exam data present  GEN: well developed, well nourished, no acute distress Eyes: conjunctiva and lids normal, PERRLA, EOMI ENT: TM clear, nares clear, oral exam WNL Neck: supple, no lymphadenopathy, no thyromegaly, no  JVD Pulm: clear to auscultation and percussion, respiratory effort normal CV: regular rate and rhythm, S1-S2, no murmur, rub or gallop, no bruits, peripheral pulses normal and symmetric, no cyanosis, clubbing, edema or varicosities GI: soft, non-tender; no hepatosplenomegaly, masses; active bowel sounds all quadrants GU: no hernia, testicular mass, penile discharge Lymph: no cervical, axillary or inguinal adenopathy MSK: gait normal, muscle tone and strength WNL, no joint swelling, effusions, discoloration, crepitus  SKIN: clear, good turgor, color WNL, no rashes, lesions, or ulcerations Neuro: normal mental status, normal strength, sensation, and motion Psych: alert; oriented to person, place and time, normally interactive and not anxious or depressed in appearance. All labs reviewed with patient.  Lipids:    Component Value Date/Time   CHOL 224* 09/21/2015 0829   TRIG 255.0* 09/21/2015 0829   HDL 52.80 09/21/2015 0829   LDLDIRECT 131.0 09/21/2015 0829   VLDL 51.0* 09/21/2015 0829   CHOLHDL 4 09/21/2015 0829   CBC: CBC Latest Ref Rng 09/21/2015 06/15/2013 02/28/2012  WBC 4.0 - 10.5 K/uL 5.7 4.8 5.4  Hemoglobin 13.0 - 17.0 g/dL 16.9 14.7 15.8  Hematocrit 39.0 - 52.0 % 50.0 43.4 47.8  Platelets 150.0 - 400.0 K/uL 263.0 219.0 751.7    Basic Metabolic Panel:    Component Value Date/Time   NA 138 09/21/2015 0829   K 4.2 09/21/2015 0829   CL 102 09/21/2015 0829   CO2 30 09/21/2015 0829   BUN 18 09/21/2015 0829   CREATININE 1.22 09/21/2015 0829   GLUCOSE 90 09/21/2015 0829   CALCIUM 9.4 09/21/2015 0829   Hepatic Function Latest Ref Rng 09/21/2015 06/15/2013 02/28/2012  Total Protein 6.0 - 8.3 g/dL 7.3 7.1 7.3  Albumin 3.5 - 5.2 g/dL 4.5 4.1 4.3  AST 0 - 37 U/L '27 30 23  '$ ALT 0 - 53 U/L '29 21 16  '$ Alk Phosphatase 39 - 117 U/L 49 34(L) 45  Total Bilirubin 0.2 - 1.2 mg/dL 0.7 1.1 0.9  Bilirubin, Direct 0.0 - 0.3 mg/dL 0.1 0.1 0.1    Lab Results  Component Value Date   TSH 1.73  01/15/2011   Lab Results  Component Value Date   PSA 0.25 01/15/2011    Assessment and Plan:   Healthcare maintenance  Health Maintenance Exam: The patient's preventative maintenance and recommended screening tests for an annual wellness exam were reviewed in full today. Brought up to date unless services declined.  Counselled on the importance of diet, exercise, and its role in overall health and mortality. The patient's FH and SH was reviewed, including their home life, tobacco status, and drug and alcohol status.  Follow-up: Return in about 1 year (around 09/25/2016). Unless noted, follow-up in 1 year for Health Maintenance Exam.  New Prescriptions   HYDROCHLOROTHIAZIDE (HYDRODIURIL) 12.5 MG TABLET    Take 1 tablet (12.5 mg total) by mouth daily.   Modified Medications   No medications on file   No orders of the defined types were placed in  this encounter.    Signed,  Maud Deed. Nyzaiah Kai, MD   Patient's Medications  New Prescriptions   HYDROCHLOROTHIAZIDE (HYDRODIURIL) 12.5 MG TABLET    Take 1 tablet (12.5 mg total) by mouth daily.  Previous Medications   AMINO ACIDS (AMINO ACID PO)    Take by mouth daily. Reported on 09/26/2015   MULTIPLE VITAMIN (MULTIVITAMIN) TABLET    Take 1 tablet by mouth 2 (two) times daily. Reported on 09/26/2015   YOHIMBINE HCL PO    Take 1 tablet by mouth 2 (two) times daily. Reported on 09/26/2015  Modified Medications   No medications on file  Discontinued Medications   No medications on file

## 2015-09-26 NOTE — Progress Notes (Signed)
Pre visit review using our clinic review tool, if applicable. No additional management support is needed unless otherwise documented below in the visit note. 

## 2015-10-27 ENCOUNTER — Telehealth: Payer: Self-pay

## 2015-10-27 MED ORDER — HYDROCHLOROTHIAZIDE 25 MG PO TABS: 25.0000 mg | ORAL_TABLET | Freq: Every day | ORAL | Status: DC

## 2015-10-27 NOTE — Telephone Encounter (Signed)
Left message for Justin Walker to return my call.

## 2015-10-27 NOTE — Telephone Encounter (Signed)
Mr. Erman notified as instructed by telephone.  He is using an adult large BP cuff.  Rx sent in as instructed by Dr. Patsy Lager.   Mr. Herzberg will call back and schedule one month follow up.

## 2015-10-27 NOTE — Telephone Encounter (Signed)
Make sure he is using a large BP cuff  Increase to HCTZ 25 mg, 1 po daily, #30, 2 refills  F/u with me in 1 mo

## 2015-10-27 NOTE — Telephone Encounter (Signed)
Pt last seen 09/26/15 and started on HCTZ 12.5 mg taking one tab daily; it is time for pt to refill med but pt concerned med is not working; pt takes BP daily and average BP is 155/95. Pt wants to know if needs to change BP med or dosage. No H/a,dizziness,CP or SOB. Pt request cb. Bridget Hartshorn.

## 2016-02-03 ENCOUNTER — Other Ambulatory Visit: Payer: Self-pay

## 2016-02-03 MED ORDER — HYDROCHLOROTHIAZIDE 25 MG PO TABS: 25.0000 mg | ORAL_TABLET | Freq: Every day | ORAL | Status: DC

## 2016-02-03 NOTE — Telephone Encounter (Signed)
Pt left v/m requesting refill HCTZ to [facility]. Pt last seen annual exam on 09/26/15; pt has CPX scheduled on 10/03/16; in 10/27/15 HCTZ was refilled # 30 x 2 and pt was to f/u appt in one month. Unable to reach pt by phone; refilled # 30 with note pt needs to schedule appt.

## 2016-07-13 ENCOUNTER — Emergency Department
Admission: EM | Admit: 2016-07-13 | Discharge: 2016-07-13 | Disposition: A | Discharge status: Home / Self Care | Payer: PRIVATE HEALTH INSURANCE | Attending: Emergency Medicine | Admitting: Emergency Medicine

## 2016-07-13 ENCOUNTER — Telehealth: Payer: Self-pay | Admitting: Cardiovascular Disease

## 2016-07-13 ENCOUNTER — Encounter: Payer: Self-pay | Admitting: Emergency Medicine

## 2016-07-13 ENCOUNTER — Emergency Department: Payer: PRIVATE HEALTH INSURANCE

## 2016-07-13 DIAGNOSIS — I10 Essential (primary) hypertension: Secondary | ICD-10-CM | POA: Diagnosis not present

## 2016-07-13 DIAGNOSIS — Z87891 Personal history of nicotine dependence: Secondary | ICD-10-CM | POA: Diagnosis not present

## 2016-07-13 DIAGNOSIS — R0789 Other chest pain: Secondary | ICD-10-CM | POA: Diagnosis present

## 2016-07-13 DIAGNOSIS — Z79899 Other long term (current) drug therapy: Secondary | ICD-10-CM | POA: Diagnosis not present

## 2016-07-13 DIAGNOSIS — R079 Chest pain, unspecified: Secondary | ICD-10-CM

## 2016-07-13 LAB — CBC
HEMATOCRIT: 51 % (ref 40.0–52.0)
HEMOGLOBIN: 17.8 g/dL (ref 13.0–18.0)
MCH: 31.5 pg (ref 26.0–34.0)
MCHC: 35 g/dL (ref 32.0–36.0)
MCV: 90.2 fL (ref 80.0–100.0)
Platelets: 203 10*3/uL (ref 150–440)
RBC: 5.65 MIL/uL (ref 4.40–5.90)
RDW: 15.7 % — AB (ref 11.5–14.5)
WBC: 6.9 10*3/uL (ref 3.8–10.6)

## 2016-07-13 LAB — BASIC METABOLIC PANEL
ANION GAP: 11 (ref 5–15)
BUN: 17 mg/dL (ref 6–20)
CALCIUM: 9.2 mg/dL (ref 8.9–10.3)
CO2: 23 mmol/L (ref 22–32)
Chloride: 103 mmol/L (ref 101–111)
Creatinine, Ser: 1.33 mg/dL — ABNORMAL HIGH (ref 0.61–1.24)
GFR calc Af Amer: >60 mL/min (ref >60)
GLUCOSE: 133 mg/dL — AB (ref 65–99)
Potassium: 3.6 mmol/L (ref 3.5–5.1)
Sodium: 137 mmol/L (ref 135–145)

## 2016-07-13 LAB — TROPONIN I

## 2016-07-13 NOTE — ED Triage Notes (Signed)
Pt presents to ED from urgent care, was told he may have had a heart attack. Pt reports non radiating central chest pain. Denies any other symptoms.

## 2016-07-13 NOTE — ED Notes (Signed)
Pt reports he went to [facility] this [facility] for episode of high blood pressure (150/98) and was told by MD that he was having a heart attack and sent to ED.   Pt c/o medial chest pain rated at 2 out of 10 described as a dull ache. Pt denies N/V, diaphoresis, dizziness/lightheadedness, weakness and SOB. Pt reports hx of smoking 20+ years ago. Pt reports hx of hypertension and hyperlipidemia. Pt reports paternal grandfather had MI.

## 2016-07-13 NOTE — Telephone Encounter (Signed)
Patient called stating he was on the way to [facility] ED due to leaving pcp office where he was told he has had a heart attack and needed to be checked out.

## 2016-07-13 NOTE — ED Notes (Signed)
ED Provider at bedside. 

## 2016-07-13 NOTE — ED Provider Notes (Signed)
[facility] Emergency Department Provider Note  Time seen: 6:10 PM  I have reviewed the triage vital signs and the nursing notes.   HISTORY  Chief Complaint Chest Pain    HPI Justin Walker is a 45 y.o. male with a past medical history of hypertension, gastric reflux who presents the emergency department referred from urgent care. According to the patient he went to urgent care today for evaluation. States he has been noting high blood pressure home around 150 systolic. He used to be on blood pressure medications but stopped them several years ago. Patient states he has been feeling intermittent chest pressure over the past several weeks. He went to urgent care for evaluation and to get placed back on blood pressure medications. They did an EKG at urgent care which looks suspicious for a possible old MI so they sent the patient to the ER for further evaluation. Patient denies any chest pain at this time. States he has not been having "pain" but states occasionally he will have a tightness sensation or palpitations in his chest. Denies any leg pain or swelling. Denies any shortness of breath or nausea or diaphoresis.  Past Medical History:  Diagnosis Date  . Elbow mass 10/2011   right  . Ganglion and cyst of synovium, tendon and bursa, right elbow 10/26/2011  . Hyperlipidemia     Patient Active Problem List   Diagnosis Date Noted  . Tachycardia 03/21/2012  . GERD 11/04/2007    Past Surgical History:  Procedure Laterality Date  . ELBOW SURGERY  2013   right  . EYE SURGERY    . FINGER TENDON REPAIR    . MASS EXCISION  10/26/2011   Procedure: EXCISION MASS;  Surgeon: Eulas PostJoshua P Landau, MD;  Location: [facility];  Service: Orthopedics;  Laterality: Right;  excision tumor soft tissue right elbow subcutaneous less than 3cm  . NASAL SINUS SURGERY    . REVISION OF SCAR ON FACE/HEAD     forehead    Prior to Admission medications   Medication Sig  Start Date End Date Taking? Authorizing Provider  Amino Acids (AMINO ACID PO) Take by mouth daily. Reported on 09/26/2015    Historical Provider, MD  hydrochlorothiazide (HYDRODIURIL) 25 MG tablet Take 1 tablet (25 mg total) by mouth daily. 02/03/16   Hannah BeatSpencer Copland, MD  Multiple Vitamin (MULTIVITAMIN) tablet Take 1 tablet by mouth 2 (two) times daily. Reported on 09/26/2015    Historical Provider, MD  YOHIMBINE HCL PO Take 1 tablet by mouth 2 (two) times daily. Reported on 09/26/2015    Historical Provider, MD    Allergies  Allergen Reactions  . Amoxicillin Rash    Family History  Problem Relation Age of Onset  . Kidney disease Father   . Hypertension Sister     Social History Social History  Substance Use Topics  . Smoking status: Former Games developermoker  . Smokeless tobacco: Never Used     Comment: quit smoking 17-18 yrs. ago  . Alcohol use Yes     Comment: 3 x/week    Review of Systems Constitutional: Negative for fever. Cardiovascular mild chest tightness, none currently. Respiratory: Negative for shortness of breath. Gastrointestinal: Negative for abdominal pain Musculoskeletal: No leg pain or swelling. 10-point ROS otherwise negative.  ____________________________________________   PHYSICAL EXAM:  VITAL SIGNS: ED Triage Vitals  Enc Vitals Group     BP 07/13/16 1430 (!) 154/113     Pulse Rate 07/13/16 1430 82  Resp 07/13/16 1430 18     Temp 07/13/16 1430 98.7 F (37.1 C)     Temp Source 07/13/16 1430 Oral     SpO2 07/13/16 1430 97 %     Weight 07/13/16 1431 225 lb (102.1 kg)     Height 07/13/16 1431 5\' 9"  (1.753 m)     Head Circumference --      Peak Flow --      Pain Score 07/13/16 1431 3     Pain Loc --      Pain Edu? --      Excl. in GC? --     Constitutional: Alert and oriented. Well appearing and in no distress. Eyes: Normal exam ENT   Head: Normocephalic and atraumatic.   Mouth/Throat: Mucous membranes are moist. Cardiovascular: Normal rate,  regular rhythm. No murmur Respiratory: Normal respiratory effort without tachypnea nor retractions. Breath sounds are clear  Gastrointestinal: Soft and nontender. No distention.   Musculoskeletal: Nontender with normal range of motion in all extremities. No lower extremity tenderness or edema. Neurologic:  Normal speech and language. No gross focal neurologic deficits Skin:  Skin is warm, dry and intact.  Psychiatric: Mood and affect are normal. Speech and behavior are normal.   ____________________________________________    EKG  EKG reviewed and interpreted by myself shows normal sinus rhythm at 76 bpm, narrow QRS, left axis deviation, largely normal intervals with no concerning ST changes.  ____________________________________________    RADIOLOGY  Chest x-ray  ____________________________________________   INITIAL IMPRESSION / ASSESSMENT AND PLAN / ED COURSE  Pertinent labs & imaging results that were available during my care of the patient were reviewed by me and considered in my medical decision making (see chart for details).  The patient presents to the emergency department for high blood pressure deferred from the urgent care for possible MI in the past based on EKG. Patient's EKG in the emergency department overall appears well, no acute concerning findings. Patient's labs are within normal limits including a negative troponin. X-ray is clear. Patient appears very well. He states his doctor started him back on a hydrochlorothiazide tablet. I discussed with the patient follow-up with Dr.Gollan which the patient has seen in the past. Patient agreeable to this plan.  ____________________________________________   FINAL CLINICAL IMPRESSION(S) / ED DIAGNOSES  Hypertension    Minna AntisKevin Aliesha Dolata, MD 07/13/16 [phone]

## 2016-07-13 NOTE — Discharge Instructions (Signed)
You have been seen in the emergency department today for chest pain. Your workup has shown normal results. As we discussed please follow-up with your primary care physician in the next 1-2 days for recheck. Return to the emergency department for any further chest pain, trouble breathing, or any other symptom personally concerning to yourself. °

## 2016-07-13 NOTE — ED Notes (Signed)
Reviewed d/c instructions, follow-up care with patient. Pt verbalized understanding.  

## 2016-07-13 NOTE — Telephone Encounter (Signed)
Pt currently in [facility] ED. 

## 2016-07-16 NOTE — Telephone Encounter (Signed)
Lmov for patient to call back and schedule ED FU seen for CP  °

## 2016-07-17 NOTE — Telephone Encounter (Signed)
Spoke to patient he is coming 08/10/16 to see Dr Mariah MillingGollan

## 2016-08-10 ENCOUNTER — Ambulatory Visit: Payer: PRIVATE HEALTH INSURANCE | Admitting: Cardiovascular Disease

## 2016-08-21 ENCOUNTER — Telehealth: Payer: Self-pay | Admitting: Cardiovascular Disease

## 2016-08-21 ENCOUNTER — Encounter: Payer: Self-pay | Admitting: Cardiovascular Disease

## 2016-08-21 ENCOUNTER — Ambulatory Visit (INDEPENDENT_AMBULATORY_CARE_PROVIDER_SITE_OTHER): Payer: PRIVATE HEALTH INSURANCE | Admitting: Cardiovascular Disease

## 2016-08-21 VITALS — BP 148/100 | HR 82 | Ht 69.0 in | Wt 218.8 lb

## 2016-08-21 DIAGNOSIS — E782 Mixed hyperlipidemia: Secondary | ICD-10-CM

## 2016-08-21 DIAGNOSIS — R000 Tachycardia, unspecified: Secondary | ICD-10-CM | POA: Diagnosis not present

## 2016-08-21 DIAGNOSIS — I471 Supraventricular tachycardia, unspecified: Secondary | ICD-10-CM | POA: Insufficient documentation

## 2016-08-21 DIAGNOSIS — E669 Obesity, unspecified: Secondary | ICD-10-CM | POA: Diagnosis not present

## 2016-08-21 DIAGNOSIS — I10 Essential (primary) hypertension: Secondary | ICD-10-CM
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
## 2016-08-21 NOTE — Telephone Encounter (Signed)
Pt calling stating he went by pharmacy and they don't have the prescriptions ready for amlodipine Please advise

## 2016-08-21 NOTE — Patient Instructions (Addendum)
Medication Instructions:   Please start amlodipine 1/2 up to 1 pill a day  Labwork:  No new labs needed  Testing/Procedures:  Research CT coronary calcium score, screening test $150   I recommend watching educational videos on topics of interest to you at:       [email or web address]  Enter code: HEARTCARE    Follow-Up: It was a pleasure seeing you in the office today. Please call us if you have new issues that need to be addressed before your next appt.  [phone]  Your physician wants you to follow-up in: as needed  If you need a refill on your cardiac medications before your next appointment, please call your pharmacy.

## 2016-08-22 MED ORDER — AMLODIPINE BESYLATE 10 MG PO TABS: 5.0000 mg | ORAL_TABLET | Freq: Every day | ORAL | 3 refills | Status: DC

## 2016-08-22 NOTE — Telephone Encounter (Signed)
Spoke with patient and let him know that I sent in prescription. Instructed him to start on 1/2 tablet once daily and if he still has problems to then increase to 1 whole tablet daily per his visit yesterday with Dr. Mariah MillingGollan. He verbalized understanding of all instructions and had no further questions at this time.

## 2016-08-22 NOTE — Telephone Encounter (Signed)
Please see note below. Amlodipine not on medication list and was not sent to local pharmacy if prescribed.

## 2016-09-27 ENCOUNTER — Other Ambulatory Visit: Payer: PRIVATE HEALTH INSURANCE

## 2016-10-01 ENCOUNTER — Other Ambulatory Visit (INDEPENDENT_AMBULATORY_CARE_PROVIDER_SITE_OTHER): Payer: BLUE CROSS/BLUE SHIELD

## 2016-10-01 DIAGNOSIS — E784 Other hyperlipidemia: Secondary | ICD-10-CM | POA: Diagnosis not present

## 2016-10-01 DIAGNOSIS — Z79899 Other long term (current) drug therapy: Secondary | ICD-10-CM

## 2016-10-01 DIAGNOSIS — E7849 Other hyperlipidemia: Secondary | ICD-10-CM

## 2016-10-01 LAB — HEPATIC FUNCTION PANEL
ALK PHOS: 46 U/L (ref 39–117)
ALT: 18 U/L (ref 0–53)
AST: 17 U/L (ref 0–37)
Albumin: 4.2 g/dL (ref 3.5–5.2)
BILIRUBIN DIRECT: 0.1 mg/dL (ref 0.0–0.3)
BILIRUBIN TOTAL: 0.4 mg/dL (ref 0.2–1.2)
Total Protein: 6.8 g/dL (ref 6.0–8.3)

## 2016-10-01 LAB — CBC WITH DIFFERENTIAL/PLATELET
BASOS PCT: 0.7 % (ref 0.0–3.0)
Basophils Absolute: 0 10*3/uL (ref 0.0–0.1)
EOS ABS: 0.1 10*3/uL (ref 0.0–0.7)
EOS PCT: 2.2 % (ref 0.0–5.0)
HCT: 45.5 % (ref 39.0–52.0)
Hemoglobin: 15.6 g/dL (ref 13.0–17.0)
LYMPHS ABS: 1.6 10*3/uL (ref 0.7–4.0)
Lymphocytes Relative: 33.6 % (ref 12.0–46.0)
MCHC: 34.3 g/dL (ref 30.0–36.0)
MCV: 92.7 fl (ref 78.0–100.0)
MONO ABS: 0.4 10*3/uL (ref 0.1–1.0)
Monocytes Relative: 9.4 % (ref 3.0–12.0)
NEUTROS ABS: 2.6 10*3/uL (ref 1.4–7.7)
NEUTROS PCT: 54.1 % (ref 43.0–77.0)
PLATELETS: 222 10*3/uL (ref 150.0–400.0)
RBC: 4.91 Mil/uL (ref 4.22–5.81)
RDW: 13.2 % (ref 11.5–15.5)
WBC: 4.7 10*3/uL (ref 4.0–10.5)

## 2016-10-01 LAB — LIPID PANEL
CHOL/HDL RATIO: 4
CHOLESTEROL: 188 mg/dL (ref 0–200)
HDL: 43.1 mg/dL (ref >39.00)
LDL Cholesterol: 121 mg/dL — ABNORMAL HIGH (ref 0–99)
NonHDL: 145.1
Triglycerides: 119 mg/dL (ref 0.0–149.0)
VLDL: 23.8 mg/dL (ref 0.0–40.0)

## 2016-10-01 LAB — BASIC METABOLIC PANEL
BUN: 16 mg/dL (ref 6–23)
CHLORIDE: 104 meq/L (ref 96–112)
CO2: 30 meq/L (ref 19–32)
CREATININE: 1.16 mg/dL (ref 0.40–1.50)
Calcium: 9.1 mg/dL (ref 8.4–10.5)
GFR: 72.91 mL/min (ref >60.00)
GLUCOSE: 94 mg/dL (ref 70–99)
POTASSIUM: 3.9 meq/L (ref 3.5–5.1)
Sodium: 140 mEq/L (ref 135–145)

## 2016-10-03 ENCOUNTER — Ambulatory Visit (INDEPENDENT_AMBULATORY_CARE_PROVIDER_SITE_OTHER): Payer: BLUE CROSS/BLUE SHIELD | Admitting: Family Medicine

## 2016-10-03 ENCOUNTER — Encounter: Payer: Self-pay | Admitting: Family Medicine

## 2016-10-03 VITALS — BP 114/80 | HR 74 | Temp 98.4°F | Ht 68.75 in | Wt 216.8 lb

## 2016-10-03 DIAGNOSIS — Z0000 Encounter for general adult medical examination without abnormal findings: Secondary | ICD-10-CM

## 2016-10-03 NOTE — Progress Notes (Signed)
Pre visit review using our clinic review tool, if applicable. No additional management support is needed unless otherwise documented below in the visit note. 
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
## 2017-07-08 IMAGING — CR DG CHEST 2V
2 series · 2 of 2 positions shown · non-contrast
Comparison: None.

CLINICAL DATA: Chest pain for 1 week

EXAM:
CHEST  2 VIEW

[chest pa]
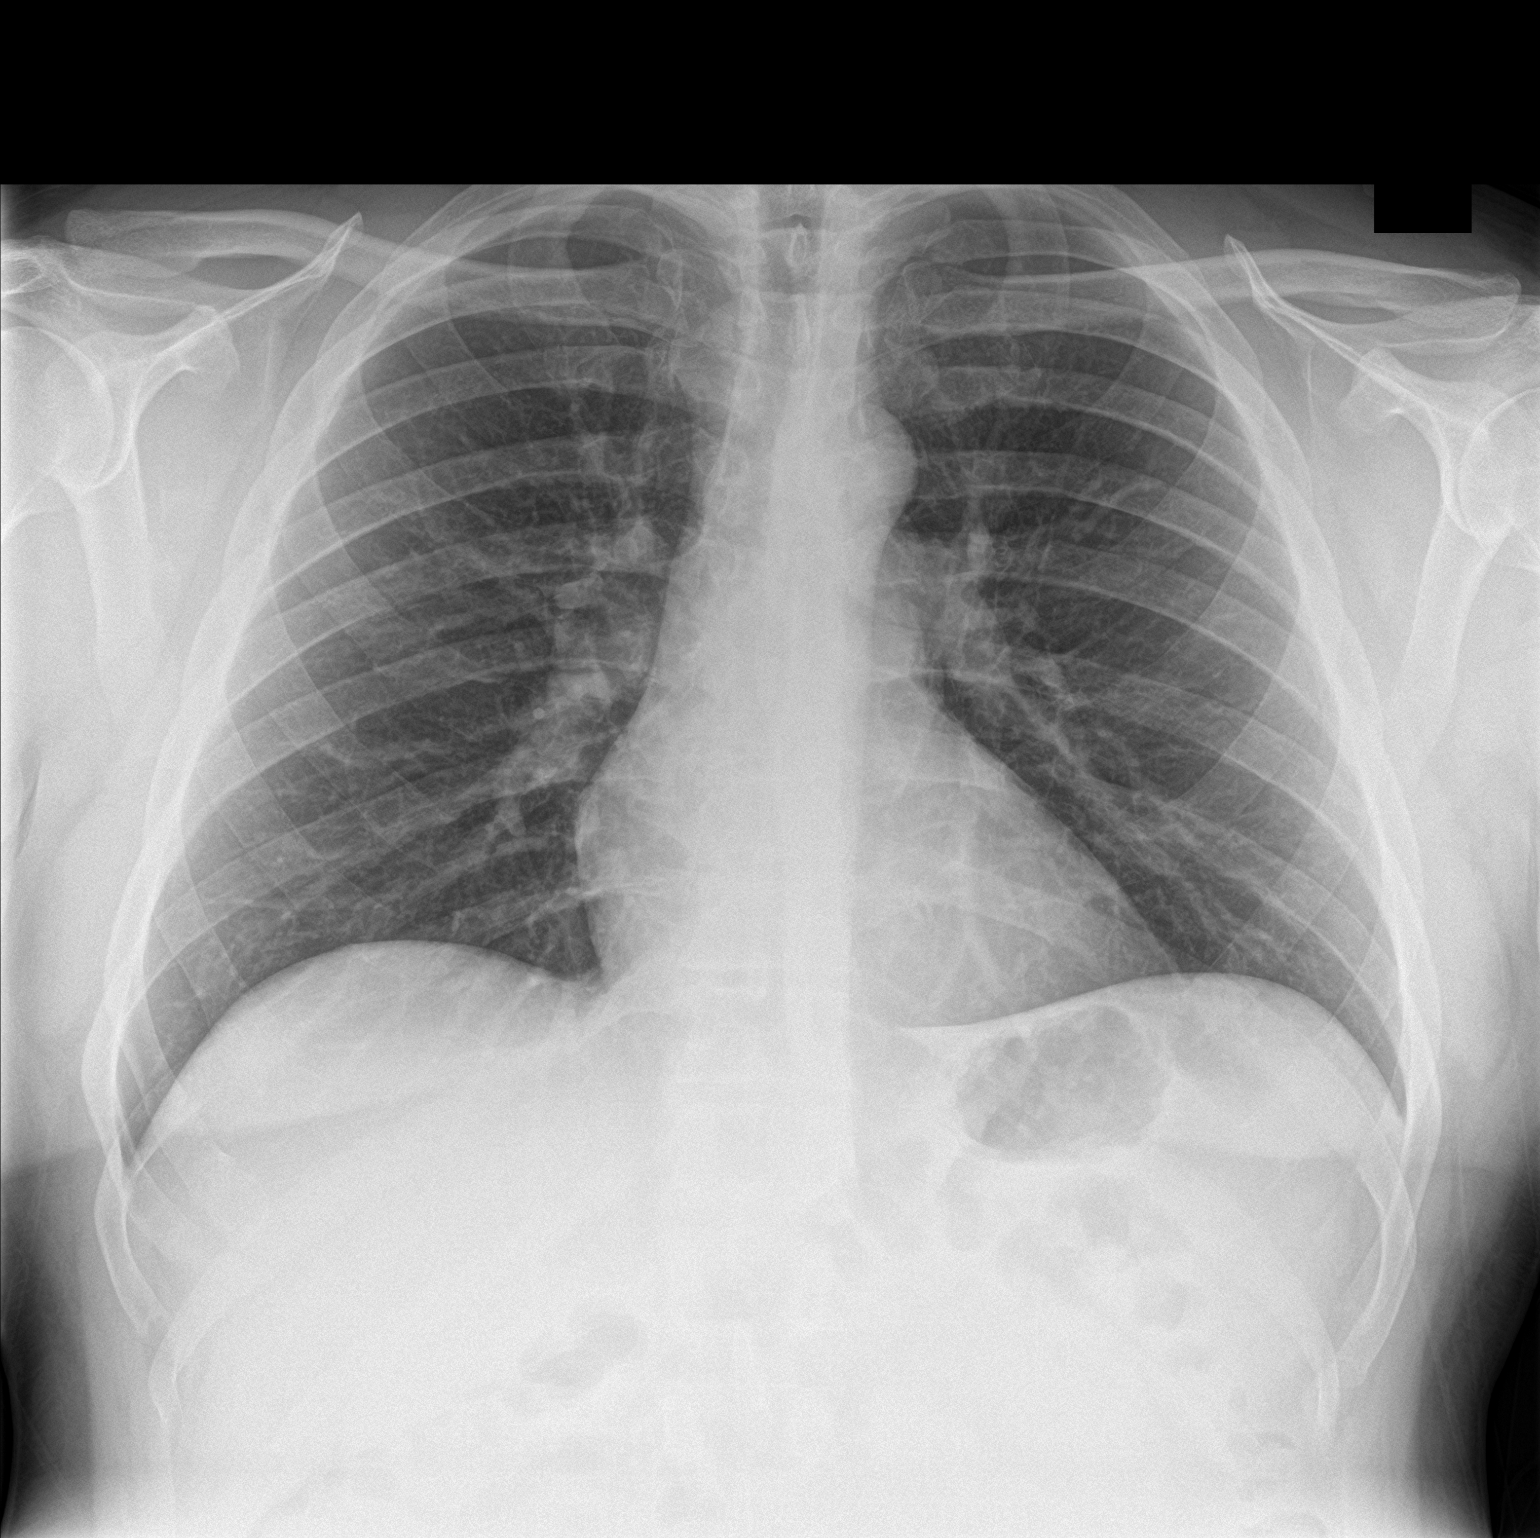

[chest lat]
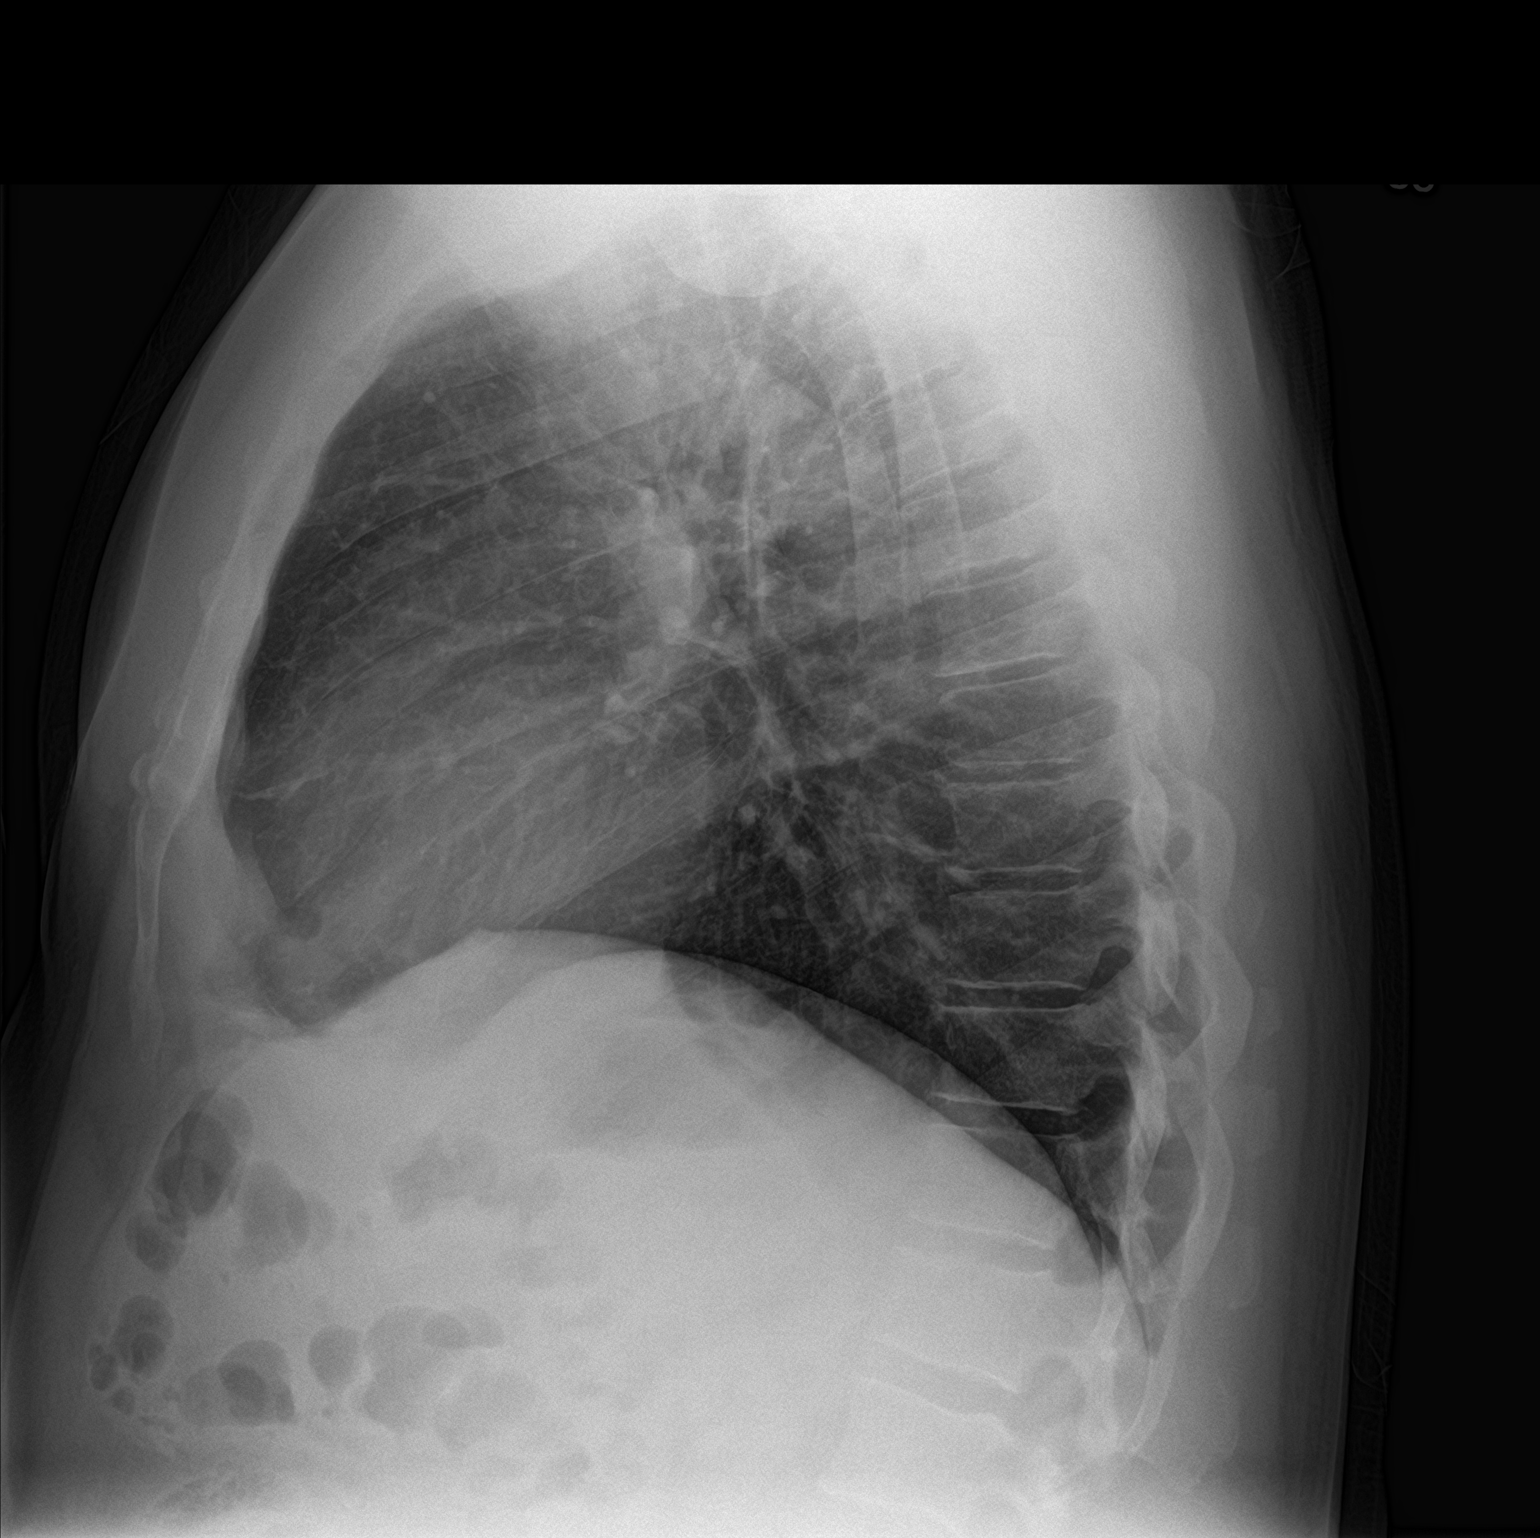

[2 of 2 positions shown; findings below may reference images not displayed]

FINDINGS: The heart size and mediastinal contours are within normal limits.
Both lungs are clear. The visualized skeletal structures are
unremarkable.
IMPRESSION: No active cardiopulmonary disease.

## 2017-09-20 ENCOUNTER — Other Ambulatory Visit: Payer: Self-pay | Admitting: Cardiovascular Disease

## 2017-09-30 ENCOUNTER — Other Ambulatory Visit (INDEPENDENT_AMBULATORY_CARE_PROVIDER_SITE_OTHER): Payer: BLUE CROSS/BLUE SHIELD

## 2017-09-30 DIAGNOSIS — Z0000 Encounter for general adult medical examination without abnormal findings: Secondary | ICD-10-CM | POA: Diagnosis not present

## 2017-09-30 DIAGNOSIS — Z114 Encounter for screening for human immunodeficiency virus [HIV]: Secondary | ICD-10-CM

## 2017-09-30 LAB — CBC WITH DIFFERENTIAL/PLATELET
BASOS ABS: 0 10*3/uL (ref 0.0–0.1)
BASOS PCT: 0.6 % (ref 0.0–3.0)
EOS ABS: 0.1 10*3/uL (ref 0.0–0.7)
Eosinophils Relative: 2.1 % (ref 0.0–5.0)
HEMATOCRIT: 47.1 % (ref 39.0–52.0)
HEMOGLOBIN: 16 g/dL (ref 13.0–17.0)
LYMPHS PCT: 27.6 % (ref 12.0–46.0)
Lymphs Abs: 1.2 10*3/uL (ref 0.7–4.0)
MCHC: 34 g/dL (ref 30.0–36.0)
MCV: 94.6 fl (ref 78.0–100.0)
MONO ABS: 0.4 10*3/uL (ref 0.1–1.0)
Monocytes Relative: 8.4 % (ref 3.0–12.0)
Neutro Abs: 2.7 10*3/uL (ref 1.4–7.7)
Neutrophils Relative %: 61.3 % (ref 43.0–77.0)
Platelets: 238 10*3/uL (ref 150.0–400.0)
RBC: 4.98 Mil/uL (ref 4.22–5.81)
RDW: 14.1 % (ref 11.5–15.5)
WBC: 4.5 10*3/uL (ref 4.0–10.5)

## 2017-09-30 LAB — LIPID PANEL
CHOL/HDL RATIO: 4
CHOLESTEROL: 218 mg/dL — AB (ref 0–200)
HDL: 49.3 mg/dL (ref >39.00)
LDL Cholesterol: 134 mg/dL — ABNORMAL HIGH (ref 0–99)
NonHDL: 168.58
TRIGLYCERIDES: 171 mg/dL — AB (ref 0.0–149.0)
VLDL: 34.2 mg/dL (ref 0.0–40.0)

## 2017-09-30 LAB — HEPATIC FUNCTION PANEL
ALBUMIN: 4.4 g/dL (ref 3.5–5.2)
ALK PHOS: 45 U/L (ref 39–117)
ALT: 22 U/L (ref 0–53)
AST: 21 U/L (ref 0–37)
BILIRUBIN DIRECT: 0.1 mg/dL (ref 0.0–0.3)
Total Bilirubin: 0.6 mg/dL (ref 0.2–1.2)
Total Protein: 6.9 g/dL (ref 6.0–8.3)

## 2017-09-30 LAB — BASIC METABOLIC PANEL
BUN: 16 mg/dL (ref 6–23)
CALCIUM: 9.6 mg/dL (ref 8.4–10.5)
CO2: 31 mEq/L (ref 19–32)
Chloride: 102 mEq/L (ref 96–112)
Creatinine, Ser: 1.22 mg/dL (ref 0.40–1.50)
GFR: 68.47 mL/min (ref >60.00)
Glucose, Bld: 100 mg/dL — ABNORMAL HIGH (ref 70–99)
Potassium: 4.4 mEq/L (ref 3.5–5.1)
Sodium: 139 mEq/L (ref 135–145)

## 2017-10-01 LAB — HIV ANTIBODY (ROUTINE TESTING W REFLEX): HIV: NONREACTIVE

## 2017-10-07 ENCOUNTER — Encounter: Payer: BLUE CROSS/BLUE SHIELD | Admitting: Family Medicine

## 2017-10-12 ENCOUNTER — Other Ambulatory Visit: Payer: Self-pay | Admitting: Cardiovascular Disease

## 2017-10-24 ENCOUNTER — Telehealth: Payer: Self-pay | Admitting: Cardiovascular Disease

## 2017-10-24 ENCOUNTER — Other Ambulatory Visit: Payer: Self-pay | Admitting: *Deleted

## 2017-10-24 MED ORDER — AMLODIPINE BESYLATE 10 MG PO TABS: 10.0000 mg | ORAL_TABLET | Freq: Every day | ORAL | 0 refills | Status: AC

## 2017-10-24 NOTE — Telephone Encounter (Signed)
If patient calls back he can keep upcoming appt already scheduled and refill has been sent

## 2017-10-24 NOTE — Telephone Encounter (Signed)
Requested Prescriptions   Signed Prescriptions Disp Refills  . amLODipine (NORVASC) 10 MG tablet 30 tablet 0    Sig: Take 1 tablet (10 mg total) by mouth daily.    Authorizing Provider: GOLLAN, TIMOTHY J    Ordering User: Michole Lecuyer C    

## 2017-10-24 NOTE — Telephone Encounter (Signed)
°*  STAT* If patient is at the pharmacy, call can be transferred to refill team.   1. Which medications need to be refilled? (please list name of each medication and dose if known)  amlodipine   2. Which pharmacy/location (including street and city if local pharmacy) is medication to be sent to? [facility] [place]   3. Do they need a 30 day or 90 day supply? 30 day

## 2017-10-24 NOTE — Telephone Encounter (Signed)
lmov to schedule an appt  °

## 2017-10-24 NOTE — Telephone Encounter (Signed)
Pt has not been seen since 2017 . Pt needs to schedule appointment .

## 2017-10-24 NOTE — Telephone Encounter (Signed)
Please disregard pt does have future appointment scheduled.

## 2017-10-24 NOTE — Telephone Encounter (Signed)
Requested Prescriptions   Signed Prescriptions Disp Refills  . amLODipine (NORVASC) 10 MG tablet 30 tablet 0    Sig: Take 1 tablet (10 mg total) by mouth daily.    Authorizing Provider: GOLLAN, TIMOTHY J    Ordering User: Rahiem Schellinger C    
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
## 2017-11-25 ENCOUNTER — Ambulatory Visit: Payer: BLUE CROSS/BLUE SHIELD | Admitting: Cardiovascular Disease

## 2017-11-25 ENCOUNTER — Telehealth: Payer: Self-pay | Admitting: Cardiovascular Disease

## 2017-11-25 NOTE — Telephone Encounter (Signed)
Pt returning our call °Please call back ° °

## 2017-11-25 NOTE — Telephone Encounter (Signed)
Left voicemail message to call back  

## 2017-11-25 NOTE — Telephone Encounter (Signed)
Pt lives in [place] and is asking for a recommendation for a local doctor near that area  Please advise

## 2017-11-27 NOTE — Telephone Encounter (Signed)
Spoke with patient and he wanted to know if we could refer him to cardiologist in [place]. Advised that I am not aware of any providers in that area and he may want to check with his PCP. He verbalized understanding and was appreciative for the call back.

## 2017-12-16 ENCOUNTER — Encounter: Payer: BLUE CROSS/BLUE SHIELD | Admitting: Family Medicine

## 2017-12-16 DIAGNOSIS — Z0289 Encounter for other administrative examinations: Secondary | ICD-10-CM
